# Patient Record
Sex: Female | Born: 1990 | Race: White | Hispanic: No | Smoking: Current every day smoker
Health system: Southern US, Community
[De-identification: ages and names within clinical notes are randomized; demographics above are authoritative.]

## PROBLEM LIST (undated history)

## (undated) ENCOUNTER — Emergency Department (HOSPITAL_COMMUNITY): Admission: EM | Payer: Medicaid Other | Source: Home / Self Care

## (undated) DIAGNOSIS — J45909 Unspecified asthma, uncomplicated: Secondary | ICD-10-CM

## (undated) DIAGNOSIS — F319 Bipolar disorder, unspecified: Secondary | ICD-10-CM

## (undated) DIAGNOSIS — K219 Gastro-esophageal reflux disease without esophagitis: Secondary | ICD-10-CM

## (undated) HISTORY — PX: MYRINGOTOMY: SUR874

## (undated) HISTORY — PX: TONSILLECTOMY: SUR1361

---
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
## 2011-04-13 LAB — DIFFERENTIAL
Lymphocytes Relative: 30 — ABNORMAL LOW
Monocytes Absolute: 0.8
Monocytes Relative: 10 — ABNORMAL HIGH
Neutro Abs: 4.8

## 2011-04-13 LAB — PROLACTIN: Prolactin: 8.9

## 2011-04-13 LAB — URINALYSIS, ROUTINE W REFLEX MICROSCOPIC
Bilirubin Urine: NEGATIVE
Ketones, ur: NEGATIVE
Nitrite: NEGATIVE
Urobilinogen, UA: 0.2

## 2011-04-13 LAB — CBC
RBC: 4.82
WBC: 8.3

## 2011-04-13 LAB — RAPID URINE DRUG SCREEN, HOSP PERFORMED
Benzodiazepines: NOT DETECTED
Cocaine: NOT DETECTED
Tetrahydrocannabinol: NOT DETECTED

## 2011-04-13 LAB — ETHANOL: Alcohol, Ethyl (B): <5

## 2011-04-13 LAB — HEPATIC FUNCTION PANEL
ALT: 17
Bilirubin, Direct: <0.1
Total Protein: 7.4

## 2011-04-13 LAB — LIPID PANEL
HDL: 36
VLDL: 19

## 2011-04-13 LAB — BASIC METABOLIC PANEL
Calcium: 9.3
Creatinine, Ser: 0.64
Sodium: 138

## 2011-04-13 LAB — PREGNANCY, URINE: Preg Test, Ur: NEGATIVE

## 2011-04-13 LAB — CORTISOL-AM, BLOOD: Cortisol - AM: 20

## 2011-04-13 LAB — HEMOGLOBIN A1C
Hgb A1c MFr Bld: 5.2
Mean Plasma Glucose: 108

## 2011-04-13 LAB — TSH: TSH: 2.85

## 2015-05-07 ENCOUNTER — Encounter (HOSPITAL_COMMUNITY): Payer: Self-pay | Admitting: *Deleted

## 2015-05-07 ENCOUNTER — Emergency Department (HOSPITAL_COMMUNITY)
Admission: EM | Admit: 2015-05-07 | Discharge: 2015-05-07 | Disposition: A | Discharge status: Home / Self Care | Payer: Self-pay | Attending: Emergency Medicine | Admitting: Emergency Medicine

## 2015-05-07 DIAGNOSIS — J45909 Unspecified asthma, uncomplicated: Secondary | ICD-10-CM | POA: Insufficient documentation

## 2015-05-07 DIAGNOSIS — Z8659 Personal history of other mental and behavioral disorders: Secondary | ICD-10-CM | POA: Insufficient documentation

## 2015-05-07 DIAGNOSIS — L02411 Cutaneous abscess of right axilla: Secondary | ICD-10-CM | POA: Insufficient documentation

## 2015-05-07 DIAGNOSIS — Z720 Tobacco use: Secondary | ICD-10-CM | POA: Insufficient documentation

## 2015-05-07 HISTORY — DX: Bipolar disorder, unspecified: F31.9

## 2015-05-07 HISTORY — DX: Unspecified asthma, uncomplicated: J45.909

## 2015-05-07 MED ORDER — HYDROCODONE-ACETAMINOPHEN 5-325 MG PO TABS
1.0000 | ORAL_TABLET | Freq: Once | ORAL | Status: AC
  Administered 2015-05-07: 1 via ORAL
  Filled 2015-05-07: qty 1

## 2015-05-07 MED ORDER — LIDOCAINE HCL (PF) 2 % IJ SOLN
10.0000 mL | Freq: Once | INTRAMUSCULAR | Status: AC
  Administered 2015-05-07: 10 mL via INTRADERMAL
  Filled 2015-05-07: qty 10

## 2015-05-07 MED ORDER — POVIDONE-IODINE 10 % EX SOLN
CUTANEOUS | Status: AC
  Administered 2015-05-07: 13:00:00
  Filled 2015-05-07: qty 118

## 2015-05-07 MED ORDER — LIDOCAINE-EPINEPHRINE-TETRACAINE (LET) SOLUTION
3.0000 mL | Freq: Once | NASAL | Status: AC
  Administered 2015-05-07: 3 mL via TOPICAL
  Filled 2015-05-07: qty 3

## 2015-05-07 MED ORDER — SULFAMETHOXAZOLE-TRIMETHOPRIM 800-160 MG PO TABS: 1.0000 | ORAL_TABLET | Freq: Two times a day (BID) | ORAL | Status: AC

## 2015-05-07 MED ORDER — HYDROCODONE-ACETAMINOPHEN 5-325 MG PO TABS: ORAL_TABLET | ORAL | Status: DC

## 2015-05-07 NOTE — Discharge Instructions (Signed)
Abscess °An abscess (boil or furuncle) is an infected area on or under the skin. This area is filled with yellowish-white fluid (pus) and other material (debris). °HOME CARE  °· Only take medicines as told by your doctor. °· If you were given antibiotic medicine, take it as directed. Finish the medicine even if you start to feel better. °· If gauze is used, follow your doctor's directions for changing the gauze. °· To avoid spreading the infection: °¨ Keep your abscess covered with a bandage. °¨ Wash your hands well. °¨ Do not share personal care items, towels, or whirlpools with others. °¨ Avoid skin contact with others. °· Keep your skin and clothes clean around the abscess. °· Keep all doctor visits as told. °GET HELP RIGHT AWAY IF:  °· You have more pain, puffiness (swelling), or redness in the wound site. °· You have more fluid or blood coming from the wound site. °· You have muscle aches, chills, or you feel sick. °· You have a fever. °MAKE SURE YOU:  °· Understand these instructions. °· Will watch your condition. °· Will get help right away if you are not doing well or get worse. °  °This information is not intended to replace advice given to you by your health care provider. Make sure you discuss any questions you have with your health care provider. °  °Document Released: 12/02/2007 Document Revised: 12/15/2011 Document Reviewed: 08/29/2011 °Elsevier Interactive Patient Education ©2016 Elsevier Inc. ° °

## 2015-05-07 NOTE — ED Notes (Signed)
Pt has abscess to right under arm. Pt states it started 3 days ago and she popped it yesterday with white and bloody discharge. NAD noted. Pt denies emesis/diarrhea.

## 2015-05-07 NOTE — ED Provider Notes (Signed)
CSN: [id number]     Arrival date & time 05/07/15  1036 History   First MD Initiated Contact with Patient 05/07/15 1106     Chief Complaint  Patient presents with  . Abscess     (Consider location/radiation/quality/duration/timing/severity/associated sxs/prior Treatment) HPI   [name] is a 24 y.o. female who presents to the Emergency Department complaining of pain, swelling and redness to her right axilla.  Symptoms began 3 days ago after shaving with a different razor.  She reports increasing pain and redness spreading around her underarm.  She reports squeezing on the area and notes drainage and bleeding to the area.  She denies neck pain, fever, chills, chest pain or hx of MRSA.  Pain is worse with right arm movement.    Past Medical History  Diagnosis Date  . Asthma   . Bipolar 1 disorder [facility](HCC)    Past Surgical History  Procedure Laterality Date  . Tonsillectomy     No family history on file. Social History  Substance Use Topics  . Smoking status: Current Every Day Smoker -- 1.50 packs/day    Types: Cigarettes  . Smokeless tobacco: None  . Alcohol Use: Yes   OB History    No data available     Review of Systems  Constitutional: Negative for fever and chills.  Gastrointestinal: Negative for nausea and vomiting.  Musculoskeletal: Negative for joint swelling and arthralgias.  Skin: Positive for color change.       Abscess   Hematological: Negative for adenopathy.  All other systems reviewed and are negative.     Allergies  Trileptal  Home Medications   Prior to Admission medications   Not on File   BP 125/78 mmHg  Pulse 115  Temp(Src) 98.2 F (36.8 C) (Oral)  Resp 15  Ht 5\' 7"  (1.702 m)  Wt 262 lb 12.8 oz (119.205 kg)  BMI 41.15 kg/m2  SpO2 100%  LMP 04/30/2015 Physical Exam  Constitutional: She is oriented to person, place, and time. She appears well-developed and well-nourished. No distress.  HENT:  Head: Normocephalic and atraumatic.   Neck: Normal range of motion.  Cardiovascular: Normal rate, regular rhythm and normal heart sounds.   No murmur heard. Pulmonary/Chest: Effort normal and breath sounds normal. No respiratory distress.  Musculoskeletal: Normal range of motion.  Lymphadenopathy:    She has no cervical adenopathy.  Neurological: She is alert and oriented to person, place, and time. She exhibits normal muscle tone. Coordination normal.  Skin: Skin is warm and dry. There is erythema.  Abscess to the right axilla with mild to moderate induration and surrounding erythema.  No drainage.  Nursing note and vitals reviewed.   ED Course  Procedures (including critical care time)   INCISION AND DRAINAGE Performed by: Maxwell CaulRIPLETT,Aarya Quebedeaux L. Consent: Verbal consent obtained. Risks and benefits: risks, benefits and alternatives were discussed Type: abscess  Body area: right axilla   Anesthesia: local infiltration  Incision was made with a #11 scalpel.  Local anesthetic: LET, lidocaine 2 % w/o epinephrine  Anesthetic total:  3ml, 2 ml respectively  Complexity: complex Blunt dissection to break up loculations  Drainage: purulent  Drainage amount: small  Packing material: 1/4 in iodoform gauze  Patient tolerance: Patient tolerated the procedure well with no immediate complications.    MDM   Final diagnoses:  Abscess of axilla, right   Pt well appearing.  Vitals stable.  Pt agrees to warm wet compresses, packing removal in two days.  Rx for vicodin  and Bactrim.       Pauline Aus, PA-C 05/08/15 1920  Lavera Guise, MD 05/08/15 [phone]

## 2015-05-07 NOTE — ED Notes (Signed)
PA at bedside.

## 2015-07-25 ENCOUNTER — Emergency Department (HOSPITAL_COMMUNITY): Payer: Self-pay

## 2015-07-25 ENCOUNTER — Encounter (HOSPITAL_COMMUNITY): Payer: Self-pay | Admitting: Emergency Medicine

## 2015-07-25 ENCOUNTER — Emergency Department (HOSPITAL_COMMUNITY)
Admission: EM | Admit: 2015-07-25 | Discharge: 2015-07-25 | Disposition: A | Discharge status: Home / Self Care | Payer: Self-pay | Attending: Emergency Medicine | Admitting: Emergency Medicine

## 2015-07-25 DIAGNOSIS — Z8659 Personal history of other mental and behavioral disorders: Secondary | ICD-10-CM | POA: Insufficient documentation

## 2015-07-25 DIAGNOSIS — M5432 Sciatica, left side: Secondary | ICD-10-CM | POA: Insufficient documentation

## 2015-07-25 DIAGNOSIS — Z3202 Encounter for pregnancy test, result negative: Secondary | ICD-10-CM | POA: Insufficient documentation

## 2015-07-25 DIAGNOSIS — G8929 Other chronic pain: Secondary | ICD-10-CM | POA: Insufficient documentation

## 2015-07-25 DIAGNOSIS — J45909 Unspecified asthma, uncomplicated: Secondary | ICD-10-CM | POA: Insufficient documentation

## 2015-07-25 DIAGNOSIS — F17210 Nicotine dependence, cigarettes, uncomplicated: Secondary | ICD-10-CM | POA: Insufficient documentation

## 2015-07-25 MED ORDER — IBUPROFEN 800 MG PO TABS: 800.0000 mg | ORAL_TABLET | Freq: Three times a day (TID) | ORAL | Status: DC

## 2015-07-25 MED ORDER — METHOCARBAMOL 500 MG PO TABS: 1000.0000 mg | ORAL_TABLET | Freq: Four times a day (QID) | ORAL | Status: AC

## 2015-07-25 MED ORDER — TRAMADOL HCL 50 MG PO TABS: 50.0000 mg | ORAL_TABLET | Freq: Four times a day (QID) | ORAL | Status: DC | PRN

## 2015-07-25 MED ORDER — KETOROLAC TROMETHAMINE 60 MG/2ML IM SOLN
60.0000 mg | Freq: Once | INTRAMUSCULAR | Status: AC
  Administered 2015-07-25: 60 mg via INTRAMUSCULAR
  Filled 2015-07-25: qty 2

## 2015-07-25 MED ORDER — METHOCARBAMOL 500 MG PO TABS
1000.0000 mg | ORAL_TABLET | Freq: Once | ORAL | Status: AC
  Administered 2015-07-25: 1000 mg via ORAL
  Filled 2015-07-25: qty 2

## 2015-07-25 NOTE — ED Notes (Signed)
POC urine preg was negative. Not crossing over in system.

## 2015-07-25 NOTE — Discharge Instructions (Signed)
Sciatica °Sciatica is pain, weakness, numbness, or tingling along the path of the sciatic nerve. The nerve starts in the lower back and runs down the back of each leg. The nerve controls the muscles in the lower leg and in the back of the knee, while also providing sensation to the back of the thigh, lower leg, and the sole of your foot. Sciatica is a symptom of another medical condition. For instance, nerve damage or certain conditions, such as a herniated disk or bone spur on the spine, pinch or put pressure on the sciatic nerve. This causes the pain, weakness, or other sensations normally associated with sciatica. Generally, sciatica only affects one side of the body. °CAUSES  °· Herniated or slipped disc. °· Degenerative disk disease. °· A pain disorder involving the narrow muscle in the buttocks (piriformis syndrome). °· Pelvic injury or fracture. °· Pregnancy. °· Tumor (rare). °SYMPTOMS  °Symptoms can vary from mild to very severe. The symptoms usually travel from the low back to the buttocks and down the back of the leg. Symptoms can include: °· Mild tingling or dull aches in the lower back, leg, or hip. °· Numbness in the back of the calf or sole of the foot. °· Burning sensations in the lower back, leg, or hip. °· Sharp pains in the lower back, leg, or hip. °· Leg weakness. °· Severe back pain inhibiting movement. °These symptoms may get worse with coughing, sneezing, laughing, or prolonged sitting or standing. Also, being overweight may worsen symptoms. °DIAGNOSIS  °Your caregiver will perform a physical exam to look for common symptoms of sciatica. He or she may ask you to do certain movements or activities that would trigger sciatic nerve pain. Other tests may be performed to find the cause of the sciatica. These may include: °· Blood tests. °· X-rays. °· Imaging tests, such as an MRI or CT scan. °TREATMENT  °Treatment is directed at the cause of the sciatic pain. Sometimes, treatment is not necessary  and the pain and discomfort goes away on its own. If treatment is needed, your caregiver may suggest: °· Over-the-counter medicines to relieve pain. °· Prescription medicines, such as anti-inflammatory medicine, muscle relaxants, or narcotics. °· Applying heat or ice to the painful area. °· Steroid injections to lessen pain, irritation, and inflammation around the nerve. °· Reducing activity during periods of pain. °· Exercising and stretching to strengthen your abdomen and improve flexibility of your spine. Your caregiver may suggest losing weight if the extra weight makes the back pain worse. °· Physical therapy. °· Surgery to eliminate what is pressing or pinching the nerve, such as a bone spur or part of a herniated disk. °HOME CARE INSTRUCTIONS  °· Only take over-the-counter or prescription medicines for pain or discomfort as directed by your caregiver. °· Apply ice to the affected area for 20 minutes, 3-4 times a day for the first 48-72 hours. Then try heat in the same way. °· Exercise, stretch, or perform your usual activities if these do not aggravate your pain. °· Attend physical therapy sessions as directed by your caregiver. °· Keep all follow-up appointments as directed by your caregiver. °· Do not wear high heels or shoes that do not provide proper support. °· Check your mattress to see if it is too soft. A firm mattress may lessen your pain and discomfort. °SEEK IMMEDIATE MEDICAL CARE IF:  °· You lose control of your bowel or bladder (incontinence). °· You have increasing weakness in the lower back, pelvis, buttocks,   or legs.  You have redness or swelling of your back.  You have a burning sensation when you urinate.  You have pain that gets worse when you lie down or awakens you at night.  Your pain is worse than you have experienced in the past.  Your pain is lasting longer than 4 weeks.  You are suddenly losing weight without reason. MAKE SURE YOU:  Understand these  instructions.  Will watch your condition.  Will get help right away if you are not doing well or get worse.   This information is not intended to replace advice given to you by your health care provider. Make sure you discuss any questions you have with your health care provider.   Document Released: 06/09/2001 Document Revised: 03/06/2015 Document Reviewed: 10/25/2011 Elsevier Interactive Patient Education 2016 [employer].     Do not drive within 4 hours of taking tramadol or robaxin as these will make you drowsy.  Avoid lifting,  Bending,  Twisting or any other activity that worsens your pain over the next week.  Apply an  icepack  to your lower back for 10-15 minutes every 2 hours for the next 2 days.  You should get rechecked if your symptoms are not better over the next 5 days,  Or you develop increased pain,  Weakness in your leg(s) or loss of bladder or bowel function - these are symptoms of a worse injury.  Your xrays are stable tonight.

## 2015-07-25 NOTE — ED Notes (Signed)
Pt c/o lower back pain that radiates down the left leg after a fall 2 days ago.

## 2015-07-25 NOTE — ED Notes (Signed)
EDPa in room with pt.

## 2015-07-26 LAB — POC URINE PREG, ED: Preg Test, Ur: NEGATIVE

## 2015-07-26 NOTE — ED Provider Notes (Signed)
CSN: [id number]     Arrival date & time 07/25/15  1936 History   First MD Initiated Contact with Patient 07/25/15 1949     Chief Complaint  Patient presents with  . Back Pain     (Consider location/radiation/quality/duration/timing/severity/associated sxs/prior Treatment) The history is provided by the patient and a parent.   [name] is a 25 y.o. 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  She describes tripping and falling backward over the porch railing, describes doing a "backward flip" before landing on her buttocks and lower back on grass and hard ground.  There is radiation of pain into her left posterior leg to her heel which is new.  There has been no weakness  in the lower extremities and no urinary or bowel retention or incontinence but reports feeling numb along her left lateral leg.  Patient does not have a history of cancer or IVDU.  The patient has taken tylenol  without significant relief of symptoms.     Past Medical History  Diagnosis Date  . Asthma   . Bipolar 1 disorder [facility])    Past Surgical History  Procedure Laterality Date  . Tonsillectomy     History reviewed. No pertinent family history. Social History  Substance Use Topics  . Smoking status: Current Every Day Smoker -- 1.50 packs/day    Types: Cigarettes  . Smokeless tobacco: None  . Alcohol Use: No   OB History    No data available     Review of Systems  Constitutional: Negative for fever.  Respiratory: Negative for shortness of breath.   Cardiovascular: Negative for chest pain and leg swelling.  Gastrointestinal: Negative for abdominal pain, constipation and abdominal distention.  Genitourinary: Negative for dysuria, urgency, frequency, flank pain and difficulty urinating.  Musculoskeletal: Positive for back pain. Negative for joint swelling  and gait problem.  Skin: Negative for rash.  Neurological: Positive for numbness. Negative for weakness.      Allergies  Trileptal  Home Medications   Prior to Admission medications   Medication Sig Start Date End Date Taking? Authorizing Provider  acetaminophen (TYLENOL) 500 MG tablet Take 500 mg by mouth every 6 (six) hours as needed for mild pain or moderate pain.   Yes Historical Provider, MD  ibuprofen (ADVIL,MOTRIN) 800 MG tablet Take 1 tablet (800 mg total) by mouth 3 (three) times daily. 07/25/15   Burgess Amor, PA-C  methocarbamol (ROBAXIN) 500 MG tablet Take 2 tablets (1,000 mg total) by mouth 4 (four) times daily. 07/25/15 08/04/15  Burgess Amor, PA-C  traMADol (ULTRAM) 50 MG tablet Take 1 tablet (50 mg total) by mouth every 6 (six) hours as needed. 07/25/15   Burgess Amor, PA-C   BP 106/63 mmHg  Pulse 85  Temp(Src) 98.3 F (36.8 C) (Oral)  Resp 16  Ht  (1.702 m)  Wt 110.768 kg  BMI 38.24 kg/m2  SpO2 96%  LMP 07/02/2015 Physical Exam  Constitutional: She appears well-developed and well-nourished.  HENT:  Head: Normocephalic.  Eyes: Conjunctivae are normal.  Neck: Normal range of motion. Neck supple.  Cardiovascular: Normal rate and intact distal pulses.   Pedal pulses normal.  Pulmonary/Chest: Effort normal.  Abdominal: Soft. Bowel sounds are normal. She exhibits no distension and no mass.  Musculoskeletal: Normal range of motion. She exhibits no edema.  Lumbar back: She exhibits tenderness. She exhibits no swelling, no edema and no spasm.  ttp left paralumbar and midline lumbar spine.   Neurological: She is alert. She has normal strength. She displays no atrophy and no tremor. A sensory deficit is present. Gait normal.  Reflex Scores:      Patellar reflexes are 2+ on the right side and 2+ on the left side. No strength deficit noted in hip and knee flexor and extensor muscle groups.  Ankle flexion and extension intact. No foot drop.  She reports decreased  sensation to fine touch lateral lower leg and foot.  Skin: Skin is warm and dry.  Psychiatric: She has a normal mood and affect.  Nursing note and vitals reviewed.   ED Course  Procedures (including critical care time) Labs Review Labs Reviewed  POC URINE PREG, ED    Imaging Review Dg Lumbar Spine Complete  07/25/2015  CLINICAL DATA:  Low back pain radiating down the left leg. Left leg gave out 2 days ago causing the patient to fall backwards onto a porch railing. EXAM: LUMBAR SPINE - COMPLETE 4+ VIEW COMPARISON:  None. FINDINGS: There is no evidence of lumbar spine fracture. Alignment is normal. Intervertebral disc spaces are maintained. IMPRESSION: Negative. Electronically Signed   By: Burman Nieves M.D.   On: 07/25/2015 20:27   I have personally reviewed and evaluated these images and lab results as part of my medical decision-making.   EKG Interpretation None      MDM   Final diagnoses:  Sciatica of left side    No neuro deficit on exam or by history to suggest emergent or surgical presentation.  discussed worsened sx that should prompt immediate re-evaluation including distal weakness, bowel/bladder retention/incontinence.   Pt prescribed ibuprofen, robaxin, tramadol.  Heat tx, activity as tolerated.  F/u with pcp if not improving over the next week.  The patient appears reasonably screened and/or stabilized for discharge and I doubt any other medical condition or other [facility] requiring further screening, evaluation, or treatment in the ED at this time prior to discharge.        Burgess Amor, PA-C 07/26/15 1328  Linwood Dibbles, MD 07/27/15 [phone]

## 2015-10-07 ENCOUNTER — Other Ambulatory Visit: Payer: Self-pay | Admitting: Neurosurgery

## 2015-10-07 DIAGNOSIS — M5126 Other intervertebral disc displacement, lumbar region: Secondary | ICD-10-CM

## 2015-10-11 ENCOUNTER — Ambulatory Visit
Admission: RE | Admit: 2015-10-11 | Discharge: 2015-10-11 | Disposition: A | Discharge status: Home / Self Care | Payer: Medicaid Other | Source: Ambulatory Visit | Attending: Neurosurgery | Admitting: Neurosurgery

## 2015-10-11 DIAGNOSIS — M5126 Other intervertebral disc displacement, lumbar region: Secondary | ICD-10-CM

## 2015-10-11 MED ORDER — IOHEXOL 180 MG/ML  SOLN
1.0000 mL | Freq: Once | INTRAMUSCULAR | Status: AC | PRN
  Administered 2015-10-11: 1 mL via EPIDURAL

## 2015-10-11 MED ORDER — METHYLPREDNISOLONE ACETATE 40 MG/ML INJ SUSP (RADIOLOG
120.0000 mg | Freq: Once | INTRAMUSCULAR | Status: AC
  Administered 2015-10-11: 120 mg via EPIDURAL

## 2015-10-11 NOTE — Discharge Instructions (Signed)

## 2016-05-04 ENCOUNTER — Encounter (HOSPITAL_COMMUNITY): Payer: Self-pay | Admitting: *Deleted

## 2016-05-04 DIAGNOSIS — R0981 Nasal congestion: Secondary | ICD-10-CM | POA: Insufficient documentation

## 2016-05-04 DIAGNOSIS — R0602 Shortness of breath: Secondary | ICD-10-CM | POA: Diagnosis not present

## 2016-05-04 DIAGNOSIS — Z5321 Procedure and treatment not carried out due to patient leaving prior to being seen by health care provider: Secondary | ICD-10-CM | POA: Insufficient documentation

## 2016-05-04 DIAGNOSIS — F17210 Nicotine dependence, cigarettes, uncomplicated: Secondary | ICD-10-CM | POA: Insufficient documentation

## 2016-05-04 NOTE — ED Triage Notes (Signed)
Pt c/o sob that started x 3 days ago; pt states she had congestion that started in her head and has moved to her chest

## 2016-05-05 ENCOUNTER — Emergency Department (HOSPITAL_COMMUNITY)
Admission: EM | Admit: 2016-05-05 | Discharge: 2016-05-05 | Disposition: A | Discharge status: Home / Self Care | Payer: Medicaid Other | Attending: Dermatology | Admitting: Dermatology

## 2016-05-05 NOTE — ED Notes (Signed)
Pt told registration she was going to [facility]

## 2016-12-23 ENCOUNTER — Emergency Department (HOSPITAL_COMMUNITY)
Admission: EM | Admit: 2016-12-23 | Discharge: 2016-12-23 | Disposition: A | Discharge status: Home Health | Payer: Medicaid Other | Attending: Emergency Medicine | Admitting: Emergency Medicine

## 2016-12-23 ENCOUNTER — Emergency Department (HOSPITAL_COMMUNITY): Payer: Medicaid Other

## 2016-12-23 ENCOUNTER — Encounter (HOSPITAL_COMMUNITY): Payer: Self-pay | Admitting: Emergency Medicine

## 2016-12-23 DIAGNOSIS — Y929 Unspecified place or not applicable: Secondary | ICD-10-CM | POA: Diagnosis not present

## 2016-12-23 DIAGNOSIS — W19XXXA Unspecified fall, initial encounter: Secondary | ICD-10-CM | POA: Insufficient documentation

## 2016-12-23 DIAGNOSIS — S63501A Unspecified sprain of right wrist, initial encounter: Secondary | ICD-10-CM | POA: Insufficient documentation

## 2016-12-23 DIAGNOSIS — F17210 Nicotine dependence, cigarettes, uncomplicated: Secondary | ICD-10-CM | POA: Insufficient documentation

## 2016-12-23 DIAGNOSIS — Y939 Activity, unspecified: Secondary | ICD-10-CM | POA: Diagnosis not present

## 2016-12-23 DIAGNOSIS — J45909 Unspecified asthma, uncomplicated: Secondary | ICD-10-CM | POA: Diagnosis not present

## 2016-12-23 DIAGNOSIS — Y999 Unspecified external cause status: Secondary | ICD-10-CM | POA: Diagnosis not present

## 2016-12-23 DIAGNOSIS — S6991XA Unspecified injury of right wrist, hand and finger(s), initial encounter: Secondary | ICD-10-CM | POA: Diagnosis present

## 2016-12-23 MED ORDER — IBUPROFEN 600 MG PO TABS: 600.0000 mg | ORAL_TABLET | Freq: Four times a day (QID) | ORAL | 0 refills | Status: DC

## 2016-12-23 MED ORDER — IBUPROFEN 800 MG PO TABS
800.0000 mg | ORAL_TABLET | Freq: Once | ORAL | Status: AC
  Administered 2016-12-23: 800 mg via ORAL
  Filled 2016-12-23: qty 1

## 2016-12-23 MED ORDER — ACETAMINOPHEN 500 MG PO TABS
1000.0000 mg | ORAL_TABLET | Freq: Once | ORAL | Status: AC
  Administered 2016-12-23: 1000 mg via ORAL
  Filled 2016-12-23: qty 2

## 2016-12-23 NOTE — ED Triage Notes (Signed)
Tripped and fell 2 days ago and caught self with right wrist. Pt c/o right wrist pain. No obvious swelling or deformity noted. Radial pulse present

## 2016-12-23 NOTE — ED Provider Notes (Signed)
[facility] DEPT Provider Note   CSN: [id number] Arrival date & time: 12/23/16  1401     History   Chief Complaint Chief Complaint  Patient presents with  . Wrist Pain    HPI [name] is a 26 y.o. female.   Wrist Pain  This is a new problem. The current episode started more than 2 days ago. The problem occurs daily. The problem has been gradually worsening. Pertinent negatives include no chest pain, no abdominal pain and no shortness of breath. Exacerbated by: movement and palpation. Nothing relieves the symptoms. She has tried a cold compress and acetaminophen for the symptoms. The treatment provided no relief.    Past Medical History:  Diagnosis Date  . Asthma   . Bipolar 1 disorder (HCC)     There are no active problems to display for this patient.   Past Surgical History:  Procedure Laterality Date  . TONSILLECTOMY      OB History    Gravida Para Term Preterm AB Living   1 1       1    SAB TAB Ectopic Multiple Live Births                   Home Medications    Prior to Admission medications   Medication Sig Start Date End Date Taking? Authorizing Provider  acetaminophen (TYLENOL) 500 MG tablet Take 500 mg by mouth every 6 (six) hours as needed for mild pain or moderate pain.    [provider]  ibuprofen (ADVIL,MOTRIN) 800 MG tablet Take 1 tablet (800 mg total) by mouth 3 (three) times daily. 07/25/15   Burgess AmorIdol, Julie, PA-C  traMADol (ULTRAM) 50 MG tablet Take 1 tablet (50 mg total) by mouth every 6 (six) hours as needed. 07/25/15   Burgess AmorIdol, Julie, PA-C    Family History History reviewed. No pertinent family history.  Social History Social History  Substance Use Topics  . Smoking status: Current Every Day Smoker    Packs/day: 1.50    Types: Cigarettes  . Smokeless tobacco: Never Used  . Alcohol use No     Allergies   Trileptal [oxcarbazepine]   Review of Systems Review of Systems  Constitutional: Negative for activity change and  appetite change.  HENT: Negative for congestion, ear discharge, ear pain, facial swelling, nosebleeds, rhinorrhea, sneezing and tinnitus.   Eyes: Negative for photophobia, pain and discharge.  Respiratory: Negative for cough, choking, shortness of breath and wheezing.   Cardiovascular: Negative for chest pain, palpitations and leg swelling.  Gastrointestinal: Negative for abdominal pain, blood in stool, constipation, diarrhea, nausea and vomiting.  Genitourinary: Negative for difficulty urinating, dysuria, flank pain, frequency and hematuria.  Musculoskeletal: Positive for arthralgias. Negative for back pain, gait problem, myalgias and neck pain.  Skin: Negative for color change, rash and wound.  Neurological: Negative for dizziness, seizures, syncope, facial asymmetry, speech difficulty, weakness and numbness.  Hematological: Negative for adenopathy. Does not bruise/bleed easily.  Psychiatric/Behavioral: Negative for agitation, confusion, hallucinations, self-injury and suicidal ideas. The patient is not nervous/anxious.      Physical Exam Updated Vital Signs BP (!) 104/47 (BP Location: Right Arm)   Pulse 84   Temp 98.1 F (36.7 C) (Oral)   Resp 17   LMP 11/28/2016   SpO2 98%   Physical Exam  Constitutional: Vital signs are normal. She appears well-developed and well-nourished. She is active.  HENT:  Head: Normocephalic and atraumatic.  Right Ear: Tympanic membrane, external ear and ear canal normal.  Left Ear: Tympanic membrane, external ear and ear canal normal.  Nose: Nose normal.  Mouth/Throat: Uvula is midline, oropharynx is clear and moist and mucous membranes are normal.  Eyes: Conjunctivae, EOM and lids are normal. Pupils are equal, round, and reactive to light.  Neck: Trachea normal, normal range of motion and phonation normal. Neck supple. Carotid bruit is not present.  Cardiovascular: Normal rate, regular rhythm and normal pulses.   Abdominal: Soft. Normal appearance  and bowel sounds are normal.  Musculoskeletal:       Right wrist: She exhibits tenderness. She exhibits no effusion, no crepitus and no deformity.  Lymphadenopathy:       Head (right side): No submental, no preauricular and no posterior auricular adenopathy present.       Head (left side): No submental, no preauricular and no posterior auricular adenopathy present.    She has no cervical adenopathy.  Neurological: She is alert. She has normal strength. No cranial nerve deficit or sensory deficit. GCS eye subscore is 4. GCS verbal subscore is 5. GCS motor subscore is 6.  Skin: Skin is warm and dry.  Psychiatric: Her speech is normal.     ED Treatments / Results  Labs (all labs ordered are listed, but only abnormal results are displayed) Labs Reviewed - No data to display  EKG  EKG Interpretation None       Radiology Dg Wrist Complete Right  Result Date: 12/23/2016 CLINICAL DATA:  Initial evaluation for acute trauma, fall, pain. EXAM: RIGHT WRIST - COMPLETE 3+ VIEW COMPARISON:  None. FINDINGS: There is no evidence of fracture or dislocation. There is no evidence of arthropathy or other focal bone abnormality. Soft tissues are unremarkable. IMPRESSION: No acute fracture or dislocation about the right wrist. Electronically Signed   By: Rise Mu M.D.   On: 12/23/2016 14:37    Procedures Procedures (including critical care time)  Medications Ordered in ED Medications  acetaminophen (TYLENOL) tablet 1,000 mg (not administered)  ibuprofen (ADVIL,MOTRIN) tablet 800 mg (not administered)     Initial Impression / Assessment and Plan / ED Course  I have reviewed the triage vital signs and the nursing notes.  Pertinent labs & imaging results that were available during my care of the patient were reviewed by me and considered in my medical decision making (see chart for details).       Final Clinical Impressions(s) / ED Diagnoses MDM Vital signs stable. Xray of  The  wrist is negative. No gross neurovascuscular changes. Pt fitted with splint. Pt will use tylenol and ibuprofen for soreness. See Dr Romeo Apple for orthopedic evaluation if not improving.   Final diagnoses:  None    New Prescriptions New Prescriptions   No medications on file     Ivery Quale, Cordelia Poche 12/23/16 1627    Vanetta Mulders, MD 12/27/16 [phone]

## 2016-12-23 NOTE — Discharge Instructions (Signed)
Please use the wrist splint for 5 to 7 days. Use 600mg  of ibuprofen and 500mg  of tylenol every 6 hours for pain and soreness. Follow up with Dr Romeo AppleHarrison for orthopedic evaluation if not improving.

## 2016-12-24 ENCOUNTER — Encounter (HOSPITAL_COMMUNITY): Payer: Self-pay | Admitting: Emergency Medicine

## 2018-03-17 ENCOUNTER — Other Ambulatory Visit: Payer: Self-pay | Admitting: Neurosurgery

## 2018-04-05 NOTE — Pre-Procedure Instructions (Signed)
[name]  04/05/2018      [facility] [address] [place] - [id number] [place] [address] Phone: [phone] Fax: [phone]    Your procedure is scheduled on Oct. 21  Report to [facility] Admitting at 8:00  A.M.  Call this number if you have problems the morning of surgery:  [phone]   Remember:   Do not eat or drink after midnight.      Take these medicines the morning of surgery with A SIP OF WATER :               pirmella              Albuterol inhaler if needed--bring to hospital                7 days prior to surgery STOP taking any Aspirin(unless otherwise instructed by your surgeon), Aleve, Naproxen, Ibuprofen, Motrin, Advil, Goody's, BC's, all herbal medications, fish oil, and all vitamins    Do not wear jewelry, make-up or nail polish.  Do not wear lotions, powders, or perfumes, or deodorant.  Do not shave 48 hours prior to surgery.  Men may shave face and neck.  Do not bring valuables to the hospital.  [facility] is not responsible for any belongings or valuables.  Contacts, dentures or bridgework may not be worn into surgery.  Leave your suitcase in the car.  After surgery it may be brought to your room.  For patients admitted to the hospital, discharge time will be determined by your treatment team.  Patients discharged the day of surgery will not be allowed to drive home.    Special instructions:   [name]- Preparing For Surgery  Before surgery, you can play an important role. Because skin is not sterile, your skin needs to be as free of germs as possible. You can reduce the number of germs on your skin by washing with CHG (chlorahexidine gluconate) Soap before surgery.  CHG is an antiseptic cleaner which kills germs and bonds with the skin to continue killing germs even after washing.    Oral Hygiene is also important to reduce your risk of infection.  Remember - BRUSH YOUR TEETH THE MORNING  OF SURGERY WITH YOUR REGULAR TOOTHPASTE  Please do not use if you have an allergy to CHG or antibacterial soaps. If your skin becomes reddened/irritated stop using the CHG.  Do not shave (including legs and underarms) for at least 48 hours prior to first CHG shower. It is OK to shave your face.  Please follow these instructions carefully.   1. Shower the NIGHT BEFORE SURGERY and the MORNING OF SURGERY with CHG.   2. If you chose to wash your hair, wash your hair first as usual with your normal shampoo.  3. After you shampoo, rinse your hair and body thoroughly to remove the shampoo.  4. Use CHG as you would any other liquid soap. You can apply CHG directly to the skin and wash gently with a scrungie or a clean washcloth.   5. Apply the CHG Soap to your body ONLY FROM THE NECK DOWN.  Do not use on open wounds or open sores. Avoid contact with your eyes, ears, mouth and genitals (private parts). Wash Face and genitals (private parts)  with your normal soap.  6. Wash thoroughly, paying special attention to the area where your surgery will be performed.  7. Thoroughly rinse your  body with warm water from the neck down.  8. DO NOT shower/wash with your normal soap after using and rinsing off the CHG Soap.  9. Pat yourself dry with a CLEAN TOWEL.  10. Wear CLEAN PAJAMAS to bed the night before surgery, wear comfortable clothes the morning of surgery  11. Place CLEAN SHEETS on your bed the night of your first shower and DO NOT SLEEP WITH PETS.    Day of Surgery:  Do not apply any deodorants/lotions.  Please wear clean clothes to the hospital/surgery center.   Remember to brush your teeth WITH YOUR REGULAR TOOTHPASTE.    Please read over the following fact sheets that you were given. Coughing and Deep Breathing and Surgical Site Infection Prevention

## 2018-04-07 ENCOUNTER — Encounter (HOSPITAL_COMMUNITY)
Admission: RE | Admit: 2018-04-07 | Discharge: 2018-04-07 | Disposition: A | Discharge status: Home / Self Care | Payer: Medicaid Other | Source: Ambulatory Visit | Attending: Neurosurgery | Admitting: Neurosurgery

## 2018-04-07 ENCOUNTER — Other Ambulatory Visit: Payer: Self-pay

## 2018-04-07 ENCOUNTER — Encounter (HOSPITAL_COMMUNITY): Payer: Self-pay | Admitting: *Deleted

## 2018-04-07 DIAGNOSIS — Z01812 Encounter for preprocedural laboratory examination: Secondary | ICD-10-CM | POA: Insufficient documentation

## 2018-04-07 HISTORY — DX: Gastro-esophageal reflux disease without esophagitis: K21.9

## 2018-04-07 LAB — CBC WITH DIFFERENTIAL/PLATELET
ABS IMMATURE GRANULOCYTES: 0.06 10*3/uL (ref 0.00–0.07)
Basophils Absolute: 0.1 10*3/uL (ref 0.0–0.1)
Basophils Relative: 0 %
Eosinophils Absolute: 0.3 10*3/uL (ref 0.0–0.5)
Eosinophils Relative: 2 %
HCT: 42 % (ref 36.0–46.0)
HEMOGLOBIN: 13.5 g/dL (ref 12.0–15.0)
Immature Granulocytes: 1 %
LYMPHS PCT: 28 %
Lymphs Abs: 3.5 10*3/uL (ref 0.7–4.0)
MCH: 28.7 pg (ref 26.0–34.0)
MCHC: 32.1 g/dL (ref 30.0–36.0)
MCV: 89.2 fL (ref 80.0–100.0)
MONO ABS: 1 10*3/uL (ref 0.1–1.0)
Monocytes Relative: 8 %
NEUTROS ABS: 7.5 10*3/uL (ref 1.7–7.7)
Neutrophils Relative %: 61 %
Platelets: 326 10*3/uL (ref 150–400)
RBC: 4.71 MIL/uL (ref 3.87–5.11)
RDW: 14.2 % (ref 11.5–15.5)
WBC: 12.4 10*3/uL — AB (ref 4.0–10.5)
nRBC: 0 % (ref 0.0–0.2)

## 2018-04-07 LAB — BASIC METABOLIC PANEL
ANION GAP: 9 (ref 5–15)
CALCIUM: 9 mg/dL (ref 8.9–10.3)
CO2: 25 mmol/L (ref 22–32)
Chloride: 106 mmol/L (ref 98–111)
Creatinine, Ser: 0.6 mg/dL (ref 0.44–1.00)
GFR calc Af Amer: >60 mL/min (ref >60)
GLUCOSE: 83 mg/dL (ref 70–99)
Potassium: 4.2 mmol/L (ref 3.5–5.1)
Sodium: 140 mmol/L (ref 135–145)

## 2018-04-07 LAB — SURGICAL PCR SCREEN
MRSA, PCR: POSITIVE — AB
STAPHYLOCOCCUS AUREUS: POSITIVE — AB

## 2018-04-07 NOTE — Progress Notes (Addendum)
PCP: none, pt. Was going to [facility] [place] until the death of  Dr. Loney Hering. States she hasn't been back.  No cardiologist  Called prescription for mupirocin to  [facility] in [facility]

## 2018-04-15 MED ORDER — CEFAZOLIN SODIUM-DEXTROSE 2-4 GM/100ML-% IV SOLN
2.0000 g | INTRAVENOUS | Status: AC
  Administered 2018-04-18: 2 g via INTRAVENOUS
  Filled 2018-04-15: qty 100

## 2018-04-15 MED ORDER — VANCOMYCIN HCL 10 G IV SOLR
1500.0000 mg | INTRAVENOUS | Status: AC
  Administered 2018-04-18: 1500 mg via INTRAVENOUS
  Filled 2018-04-15: qty 1500

## 2018-04-18 ENCOUNTER — Ambulatory Visit (HOSPITAL_COMMUNITY): Payer: Medicaid Other

## 2018-04-18 ENCOUNTER — Encounter (HOSPITAL_COMMUNITY): Payer: Self-pay

## 2018-04-18 ENCOUNTER — Observation Stay (HOSPITAL_COMMUNITY)
Admission: RE | Admit: 2018-04-18 | Discharge: 2018-04-18 | Disposition: A | Discharge status: Home / Self Care | Payer: Medicaid Other | Source: Ambulatory Visit | Attending: Neurosurgery | Admitting: Neurosurgery

## 2018-04-18 ENCOUNTER — Other Ambulatory Visit: Payer: Self-pay

## 2018-04-18 ENCOUNTER — Encounter (HOSPITAL_COMMUNITY)
Admission: RE | Disposition: A | Discharge status: Home / Self Care | Payer: Self-pay | Source: Ambulatory Visit | Attending: Neurosurgery

## 2018-04-18 DIAGNOSIS — M5416 Radiculopathy, lumbar region: Secondary | ICD-10-CM | POA: Diagnosis present

## 2018-04-18 DIAGNOSIS — K219 Gastro-esophageal reflux disease without esophagitis: Secondary | ICD-10-CM | POA: Diagnosis not present

## 2018-04-18 DIAGNOSIS — Z888 Allergy status to other drugs, medicaments and biological substances status: Secondary | ICD-10-CM | POA: Insufficient documentation

## 2018-04-18 DIAGNOSIS — F319 Bipolar disorder, unspecified: Secondary | ICD-10-CM | POA: Diagnosis not present

## 2018-04-18 DIAGNOSIS — F17210 Nicotine dependence, cigarettes, uncomplicated: Secondary | ICD-10-CM | POA: Insufficient documentation

## 2018-04-18 DIAGNOSIS — Z79899 Other long term (current) drug therapy: Secondary | ICD-10-CM | POA: Diagnosis not present

## 2018-04-18 DIAGNOSIS — J45909 Unspecified asthma, uncomplicated: Secondary | ICD-10-CM | POA: Insufficient documentation

## 2018-04-18 DIAGNOSIS — Z419 Encounter for procedure for purposes other than remedying health state, unspecified: Secondary | ICD-10-CM

## 2018-04-18 DIAGNOSIS — M5117 Intervertebral disc disorders with radiculopathy, lumbosacral region: Principal | ICD-10-CM | POA: Insufficient documentation

## 2018-04-18 HISTORY — PX: LUMBAR LAMINECTOMY/DECOMPRESSION MICRODISCECTOMY: SHX5026

## 2018-04-18 LAB — POCT PREGNANCY, URINE: Preg Test, Ur: NEGATIVE

## 2018-04-18 SURGERY — LUMBAR LAMINECTOMY/DECOMPRESSION MICRODISCECTOMY 1 LEVEL
Anesthesia: General | Site: Back | Laterality: Left

## 2018-04-18 MED ORDER — ROCURONIUM BROMIDE 50 MG/5ML IV SOSY
PREFILLED_SYRINGE | INTRAVENOUS | Status: DC | PRN
  Administered 2018-04-18: 50 mg via INTRAVENOUS
  Administered 2018-04-18: 20 mg via INTRAVENOUS

## 2018-04-18 MED ORDER — CHLORHEXIDINE GLUCONATE CLOTH 2 % EX PADS: 6.0000 | MEDICATED_PAD | Freq: Once | CUTANEOUS | Status: DC

## 2018-04-18 MED ORDER — DEXAMETHASONE SODIUM PHOSPHATE 10 MG/ML IJ SOLN
INTRAMUSCULAR | Status: AC
  Filled 2018-04-18: qty 1

## 2018-04-18 MED ORDER — 0.9 % SODIUM CHLORIDE (POUR BTL) OPTIME
TOPICAL | Status: DC | PRN
  Administered 2018-04-18: 1000 mL

## 2018-04-18 MED ORDER — CYCLOBENZAPRINE HCL 10 MG PO TABS
ORAL_TABLET | ORAL | Status: AC
  Administered 2018-04-18: 10 mg via ORAL
  Filled 2018-04-18: qty 1

## 2018-04-18 MED ORDER — PROPOFOL 10 MG/ML IV BOLUS
INTRAVENOUS | Status: DC | PRN
  Administered 2018-04-18: 200 mg via INTRAVENOUS
  Administered 2018-04-18: 40 mg via INTRAVENOUS

## 2018-04-18 MED ORDER — KETOROLAC TROMETHAMINE 30 MG/ML IJ SOLN
INTRAMUSCULAR | Status: AC
  Filled 2018-04-18: qty 1

## 2018-04-18 MED ORDER — SODIUM CHLORIDE 0.9 % IV SOLN: 250.0000 mL | INTRAVENOUS | Status: DC

## 2018-04-18 MED ORDER — FENTANYL CITRATE (PF) 100 MCG/2ML IJ SOLN: 25.0000 ug | INTRAMUSCULAR | Status: DC | PRN

## 2018-04-18 MED ORDER — HYDROMORPHONE HCL 1 MG/ML IJ SOLN: 1.0000 mg | INTRAMUSCULAR | Status: DC | PRN

## 2018-04-18 MED ORDER — HYDROCODONE-ACETAMINOPHEN 5-325 MG PO TABS: 1.0000 | ORAL_TABLET | ORAL | Status: DC | PRN

## 2018-04-18 MED ORDER — ACETAMINOPHEN 650 MG RE SUPP: 650.0000 mg | RECTAL | Status: DC | PRN

## 2018-04-18 MED ORDER — DEXAMETHASONE SODIUM PHOSPHATE 10 MG/ML IJ SOLN
10.0000 mg | INTRAMUSCULAR | Status: AC
  Administered 2018-04-18: 10 mg via INTRAVENOUS

## 2018-04-18 MED ORDER — MENTHOL 3 MG MT LOZG: 1.0000 | LOZENGE | OROMUCOSAL | Status: DC | PRN

## 2018-04-18 MED ORDER — FENTANYL CITRATE (PF) 250 MCG/5ML IJ SOLN
INTRAMUSCULAR | Status: AC
  Filled 2018-04-18: qty 5

## 2018-04-18 MED ORDER — PHENOL 1.4 % MT LIQD: 1.0000 | OROMUCOSAL | Status: DC | PRN

## 2018-04-18 MED ORDER — SUGAMMADEX SODIUM 500 MG/5ML IV SOLN
INTRAVENOUS | Status: AC
  Filled 2018-04-18: qty 5

## 2018-04-18 MED ORDER — KETOROLAC TROMETHAMINE 30 MG/ML IJ SOLN
INTRAMUSCULAR | Status: DC | PRN
  Administered 2018-04-18: 30 mg via INTRAVENOUS

## 2018-04-18 MED ORDER — ALBUTEROL SULFATE HFA 108 (90 BASE) MCG/ACT IN AERS
INHALATION_SPRAY | RESPIRATORY_TRACT | Status: AC
  Filled 2018-04-18: qty 6.7

## 2018-04-18 MED ORDER — LIDOCAINE 2% (20 MG/ML) 5 ML SYRINGE
INTRAMUSCULAR | Status: AC
  Filled 2018-04-18: qty 5

## 2018-04-18 MED ORDER — FENTANYL CITRATE (PF) 100 MCG/2ML IJ SOLN
INTRAMUSCULAR | Status: AC
  Filled 2018-04-18: qty 2

## 2018-04-18 MED ORDER — NORETHINDRONE-ETH ESTRADIOL 1-35 MG-MCG PO TABS: 1.0000 | ORAL_TABLET | Freq: Every day | ORAL | Status: DC

## 2018-04-18 MED ORDER — SODIUM CHLORIDE 0.9% FLUSH: 3.0000 mL | INTRAVENOUS | Status: DC | PRN

## 2018-04-18 MED ORDER — ACETAMINOPHEN 325 MG PO TABS: 650.0000 mg | ORAL_TABLET | ORAL | Status: DC | PRN

## 2018-04-18 MED ORDER — ONDANSETRON HCL 4 MG/2ML IJ SOLN
INTRAMUSCULAR | Status: AC
  Filled 2018-04-18: qty 2

## 2018-04-18 MED ORDER — CEFAZOLIN SODIUM-DEXTROSE 1-4 GM/50ML-% IV SOLN
1.0000 g | Freq: Three times a day (TID) | INTRAVENOUS | Status: DC
  Administered 2018-04-18: 1 g via INTRAVENOUS
  Filled 2018-04-18: qty 50

## 2018-04-18 MED ORDER — THROMBIN 5000 UNITS EX SOLR
CUTANEOUS | Status: DC | PRN
  Administered 2018-04-18 (×2): 5000 [IU] via TOPICAL

## 2018-04-18 MED ORDER — CYCLOBENZAPRINE HCL 10 MG PO TABS
10.0000 mg | ORAL_TABLET | Freq: Three times a day (TID) | ORAL | Status: DC | PRN
  Administered 2018-04-18: 10 mg via ORAL

## 2018-04-18 MED ORDER — PROPOFOL 10 MG/ML IV BOLUS
INTRAVENOUS | Status: AC
  Filled 2018-04-18: qty 20

## 2018-04-18 MED ORDER — CYCLOBENZAPRINE HCL 10 MG PO TABS: 10.0000 mg | ORAL_TABLET | Freq: Three times a day (TID) | ORAL | 0 refills | Status: DC | PRN

## 2018-04-18 MED ORDER — SODIUM CHLORIDE 0.9% FLUSH: 3.0000 mL | Freq: Two times a day (BID) | INTRAVENOUS | Status: DC

## 2018-04-18 MED ORDER — ONDANSETRON HCL 4 MG/2ML IJ SOLN
INTRAMUSCULAR | Status: DC | PRN
  Administered 2018-04-18: 4 mg via INTRAVENOUS

## 2018-04-18 MED ORDER — ONDANSETRON HCL 4 MG/2ML IJ SOLN: 4.0000 mg | Freq: Four times a day (QID) | INTRAMUSCULAR | Status: DC | PRN

## 2018-04-18 MED ORDER — ONDANSETRON HCL 4 MG PO TABS: 4.0000 mg | ORAL_TABLET | Freq: Four times a day (QID) | ORAL | Status: DC | PRN

## 2018-04-18 MED ORDER — BUPIVACAINE HCL (PF) 0.25 % IJ SOLN
INTRAMUSCULAR | Status: AC
  Filled 2018-04-18: qty 30

## 2018-04-18 MED ORDER — ALBUTEROL SULFATE HFA 108 (90 BASE) MCG/ACT IN AERS
INHALATION_SPRAY | RESPIRATORY_TRACT | Status: DC | PRN
  Administered 2018-04-18 (×2): 4 via RESPIRATORY_TRACT

## 2018-04-18 MED ORDER — KETOROLAC TROMETHAMINE 15 MG/ML IJ SOLN
30.0000 mg | Freq: Four times a day (QID) | INTRAMUSCULAR | Status: DC
  Administered 2018-04-18 (×2): 30 mg via INTRAVENOUS
  Filled 2018-04-18 (×2): qty 2

## 2018-04-18 MED ORDER — OXYCODONE HCL 5 MG PO TABS
5.0000 mg | ORAL_TABLET | Freq: Once | ORAL | Status: AC | PRN
  Administered 2018-04-18: 5 mg via ORAL

## 2018-04-18 MED ORDER — MIDAZOLAM HCL 5 MG/5ML IJ SOLN
INTRAMUSCULAR | Status: DC | PRN
  Administered 2018-04-18: 2 mg via INTRAVENOUS

## 2018-04-18 MED ORDER — BUPIVACAINE HCL (PF) 0.25 % IJ SOLN
INTRAMUSCULAR | Status: DC | PRN
  Administered 2018-04-18: 20 mL

## 2018-04-18 MED ORDER — PHENYLEPHRINE 40 MCG/ML (10ML) SYRINGE FOR IV PUSH (FOR BLOOD PRESSURE SUPPORT)
PREFILLED_SYRINGE | INTRAVENOUS | Status: AC
  Filled 2018-04-18: qty 10

## 2018-04-18 MED ORDER — HEMOSTATIC AGENTS (NO CHARGE) OPTIME
TOPICAL | Status: DC | PRN
  Administered 2018-04-18: 1 via TOPICAL

## 2018-04-18 MED ORDER — SUGAMMADEX SODIUM 500 MG/5ML IV SOLN
INTRAVENOUS | Status: DC | PRN
  Administered 2018-04-18: 350 mg via INTRAVENOUS

## 2018-04-18 MED ORDER — ALBUTEROL SULFATE HFA 108 (90 BASE) MCG/ACT IN AERS: 2.0000 | INHALATION_SPRAY | Freq: Four times a day (QID) | RESPIRATORY_TRACT | Status: DC | PRN

## 2018-04-18 MED ORDER — THROMBIN 5000 UNITS EX SOLR
CUTANEOUS | Status: AC
  Filled 2018-04-18: qty 10000

## 2018-04-18 MED ORDER — SODIUM CHLORIDE 0.9 % IV SOLN
INTRAVENOUS | Status: DC | PRN
  Administered 2018-04-18: 11:00:00

## 2018-04-18 MED ORDER — OXYCODONE HCL 5 MG PO TABS
ORAL_TABLET | ORAL | Status: AC
  Administered 2018-04-18: 5 mg via ORAL
  Filled 2018-04-18: qty 1

## 2018-04-18 MED ORDER — OXYCODONE HCL 5 MG/5ML PO SOLN: 5.0000 mg | Freq: Once | ORAL | Status: AC | PRN

## 2018-04-18 MED ORDER — HYDROCODONE-ACETAMINOPHEN 5-325 MG PO TABS: 1.0000 | ORAL_TABLET | ORAL | 0 refills | Status: DC | PRN

## 2018-04-18 MED ORDER — ROCURONIUM BROMIDE 50 MG/5ML IV SOSY
PREFILLED_SYRINGE | INTRAVENOUS | Status: AC
  Filled 2018-04-18: qty 5

## 2018-04-18 MED ORDER — LACTATED RINGERS IV SOLN
INTRAVENOUS | Status: DC
  Administered 2018-04-18: 09:00:00 via INTRAVENOUS

## 2018-04-18 MED ORDER — FENTANYL CITRATE (PF) 100 MCG/2ML IJ SOLN
INTRAMUSCULAR | Status: DC | PRN
  Administered 2018-04-18 (×3): 50 ug via INTRAVENOUS
  Administered 2018-04-18: 100 ug via INTRAVENOUS

## 2018-04-18 MED ORDER — HYDROCODONE-ACETAMINOPHEN 10-325 MG PO TABS
2.0000 | ORAL_TABLET | ORAL | Status: DC | PRN
  Administered 2018-04-18: 2 via ORAL
  Filled 2018-04-18: qty 2

## 2018-04-18 MED ORDER — LIDOCAINE 2% (20 MG/ML) 5 ML SYRINGE
INTRAMUSCULAR | Status: DC | PRN
  Administered 2018-04-18: 60 mg via INTRAVENOUS

## 2018-04-18 MED ORDER — MIDAZOLAM HCL 2 MG/2ML IJ SOLN
INTRAMUSCULAR | Status: AC
  Filled 2018-04-18: qty 2

## 2018-04-18 SURGICAL SUPPLY — 60 items
ADH SKN CLS APL DERMABOND .7 (GAUZE/BANDAGES/DRESSINGS) ×1
APL SKNCLS STERI-STRIP NONHPOA (GAUZE/BANDAGES/DRESSINGS) ×1
BAG DECANTER FOR FLEXI CONT (MISCELLANEOUS) ×3 IMPLANT
BENZOIN TINCTURE PRP APPL 2/3 (GAUZE/BANDAGES/DRESSINGS) ×3 IMPLANT
BLADE CLIPPER SURG (BLADE) IMPLANT
BUR CUTTER 7.0 ROUND (BURR) ×3 IMPLANT
CANISTER SUCT 3000ML PPV (MISCELLANEOUS) ×3 IMPLANT
CARTRIDGE OIL MAESTRO DRILL (MISCELLANEOUS) ×1 IMPLANT
CLOSURE WOUND 1/2 X4 (GAUZE/BANDAGES/DRESSINGS) ×1
COVER WAND RF STERILE (DRAPES) ×3 IMPLANT
DECANTER SPIKE VIAL GLASS SM (MISCELLANEOUS) ×3 IMPLANT
DERMABOND ADVANCED (GAUZE/BANDAGES/DRESSINGS) ×2
DERMABOND ADVANCED .7 DNX12 (GAUZE/BANDAGES/DRESSINGS) ×1 IMPLANT
DIFFUSER DRILL AIR PNEUMATIC (MISCELLANEOUS) ×3 IMPLANT
DRAPE HALF SHEET 40X57 (DRAPES) ×2 IMPLANT
DRAPE LAPAROTOMY 100X72X124 (DRAPES) ×3 IMPLANT
DRAPE MICROSCOPE LEICA (MISCELLANEOUS) ×3 IMPLANT
DRAPE SURG 17X23 STRL (DRAPES) ×6 IMPLANT
DRSG OPSITE POSTOP 3X4 (GAUZE/BANDAGES/DRESSINGS) ×2 IMPLANT
DRSG OPSITE POSTOP 4X6 (GAUZE/BANDAGES/DRESSINGS) IMPLANT
DURAPREP 26ML APPLICATOR (WOUND CARE) ×3 IMPLANT
ELECT REM PT RETURN 9FT ADLT (ELECTROSURGICAL) ×3
ELECTRODE REM PT RTRN 9FT ADLT (ELECTROSURGICAL) ×1 IMPLANT
GAUZE 4X4 16PLY RFD (DISPOSABLE) IMPLANT
GAUZE SPONGE 4X4 12PLY STRL (GAUZE/BANDAGES/DRESSINGS) ×1 IMPLANT
GLOVE BIO SURGEON STRL SZ 6.5 (GLOVE) ×1 IMPLANT
GLOVE BIO SURGEONS STRL SZ 6.5 (GLOVE) ×1
GLOVE BIOGEL PI IND STRL 6.5 (GLOVE) IMPLANT
GLOVE BIOGEL PI IND STRL 7.5 (GLOVE) IMPLANT
GLOVE BIOGEL PI IND STRL 8 (GLOVE) IMPLANT
GLOVE BIOGEL PI INDICATOR 6.5 (GLOVE) ×2
GLOVE BIOGEL PI INDICATOR 7.5 (GLOVE) ×2
GLOVE BIOGEL PI INDICATOR 8 (GLOVE) ×4
GLOVE ECLIPSE 7.5 STRL STRAW (GLOVE) ×6 IMPLANT
GLOVE ECLIPSE 9.0 STRL (GLOVE) ×3 IMPLANT
GLOVE EXAM NITRILE LRG STRL (GLOVE) IMPLANT
GLOVE EXAM NITRILE XL STR (GLOVE) IMPLANT
GLOVE EXAM NITRILE XS STR PU (GLOVE) IMPLANT
GOWN STRL REUS W/ TWL LRG LVL3 (GOWN DISPOSABLE) IMPLANT
GOWN STRL REUS W/ TWL XL LVL3 (GOWN DISPOSABLE) ×1 IMPLANT
GOWN STRL REUS W/TWL 2XL LVL3 (GOWN DISPOSABLE) ×2 IMPLANT
GOWN STRL REUS W/TWL LRG LVL3 (GOWN DISPOSABLE) ×3
GOWN STRL REUS W/TWL XL LVL3 (GOWN DISPOSABLE) ×3
KIT BASIN OR (CUSTOM PROCEDURE TRAY) ×3 IMPLANT
KIT TURNOVER KIT B (KITS) ×3 IMPLANT
NDL SPNL 22GX3.5 QUINCKE BK (NEEDLE) IMPLANT
NEEDLE HYPO 22GX1.5 SAFETY (NEEDLE) ×3 IMPLANT
NEEDLE SPNL 22GX3.5 QUINCKE BK (NEEDLE) ×3 IMPLANT
NS IRRIG 1000ML POUR BTL (IV SOLUTION) ×3 IMPLANT
OIL CARTRIDGE MAESTRO DRILL (MISCELLANEOUS) ×3
PACK LAMINECTOMY NEURO (CUSTOM PROCEDURE TRAY) ×3 IMPLANT
PAD ARMBOARD 7.5X6 YLW CONV (MISCELLANEOUS) ×9 IMPLANT
RUBBERBAND STERILE (MISCELLANEOUS) ×6 IMPLANT
SPONGE SURGIFOAM ABS GEL SZ50 (HEMOSTASIS) ×3 IMPLANT
STRIP CLOSURE SKIN 1/2X4 (GAUZE/BANDAGES/DRESSINGS) ×2 IMPLANT
SUT VIC AB 2-0 CT1 18 (SUTURE) ×3 IMPLANT
SUT VIC AB 3-0 SH 8-18 (SUTURE) ×3 IMPLANT
TOWEL GREEN STERILE (TOWEL DISPOSABLE) ×3 IMPLANT
TOWEL GREEN STERILE FF (TOWEL DISPOSABLE) ×3 IMPLANT
WATER STERILE IRR 1000ML POUR (IV SOLUTION) ×3 IMPLANT

## 2018-04-18 NOTE — Anesthesia Postprocedure Evaluation (Signed)
Anesthesia Post Note  Patient: [name]  Procedure(s) Performed: Microdiscectomy left - Lumbar five-Sacral one (Left Back)     Patient location during evaluation: PACU Anesthesia Type: General Level of consciousness: awake and alert Pain management: pain level controlled Vital Signs Assessment: post-procedure vital signs reviewed and stable Respiratory status: spontaneous breathing, nonlabored ventilation, respiratory function stable and patient connected to nasal cannula oxygen Cardiovascular status: blood pressure returned to baseline and stable Postop Assessment: no apparent nausea or vomiting Anesthetic complications: no    Last Vitals:  Vitals:   04/18/18 1220 04/18/18 1302  BP: 107/69 115/76  Pulse: 76 85  Resp: 16 18  Temp: (!) 36.3 C 37.2 C  SpO2: 95% 96%    Last Pain:  Vitals:   04/18/18 1302  TempSrc: Oral  PainSc:                  [name] S

## 2018-04-18 NOTE — Transfer of Care (Signed)
Immediate Anesthesia Transfer of Care Note  Patient: [name]  Procedure(s) Performed: Microdiscectomy left - Lumbar five-Sacral one (Left Back)  Patient Location: PACU  Anesthesia Type:General  Level of Consciousness: awake and alert   Airway & Oxygen Therapy: Patient Spontanous Breathing and Patient connected to face mask oxygen  Post-op Assessment: Report given to RN, Post -op Vital signs reviewed and stable, Patient moving all extremities and Patient moving all extremities X 4  Post vital signs: Reviewed and stable  Last Vitals:  Vitals Value Taken Time  BP 112/64 04/18/2018 11:36 AM  Temp    Pulse 110 04/18/2018 11:37 AM  Resp 19 04/18/2018 11:37 AM  SpO2 96 % 04/18/2018 11:37 AM  Vitals shown include unvalidated device data.  Last Pain:  Vitals:   04/18/18 0844  TempSrc:   PainSc: 7       Patients Stated Pain Goal: 4 (04/18/18 0844)  Complications: No apparent anesthesia complications

## 2018-04-18 NOTE — H&P (Signed)
  [name] is an 27 y.o. female.   Chief Complaint: Back and left leg pain HPI: 27 year old female with severe back and left lower extremity pain failing conservative management her work-up demonstrates evidence of a large left-sided L5-S1 disc herniation with caudal migration.  Patient presents now for laminotomy and microdiscectomy in hopes of improving her symptoms.  Past Medical History:  Diagnosis Date  . Asthma   . Bipolar 1 disorder (HCC)    " states she uded to be when she was a child"  . GERD (gastroesophageal reflux disease)     Past Surgical History:  Procedure Laterality Date  . MYRINGOTOMY Bilateral    as a child  . TONSILLECTOMY      History reviewed. No pertinent family history. Social History:  reports that she has been smoking cigarettes. She has a 14.00 pack-year smoking history. She has never used smokeless tobacco. She reports that she does not drink alcohol or use drugs.  Allergies:  Allergies  Allergen Reactions  . Trileptal [Oxcarbazepine] Nausea And Vomiting    Medications Prior to Admission  Medication Sig Dispense Refill  . PIRMELLA 1/35 tablet Take 1 tablet by mouth daily.  3  . albuterol (PROVENTIL HFA;VENTOLIN HFA) 108 (90 Base) MCG/ACT inhaler Inhale 2 puffs into the lungs every 6 (six) hours as needed for wheezing or shortness of breath.    Marland Kitchen ibuprofen (ADVIL,MOTRIN) 600 MG tablet Take 1 tablet (600 mg total) by mouth 4 (four) times daily. (Patient not taking: Reported on 04/01/2018) 30 tablet 0    Results for orders placed or performed during the hospital encounter of 04/18/18 (from the past 48 hour(s))  Pregnancy, urine POC     Status: None   Collection Time: 04/18/18  8:32 AM  Result Value Ref Range   Preg Test, Ur NEGATIVE NEGATIVE    Comment:        THE SENSITIVITY OF THIS METHODOLOGY IS >24 mIU/mL    No results found.  Pertinent items noted in HPI and remainder of comprehensive ROS otherwise negative.  Blood pressure 130/68,  pulse 94, temperature 98.1 F (36.7 C), temperature source Oral, resp. rate 20, height 5' 5.25" (1.657 m), weight 112.2 kg, last menstrual period 03/24/2018, SpO2 94 %, unknown if currently breastfeeding.  Patient is awake and alert.  She is oriented and appropriate.  Cranial nerve function is intact.  Speech is fluent.  Judgment insight reasonably intact.  Motor examination with mild weakness of her left-sided gastrocnemius muscle otherwise motor strength intact.  Sensory examination with decreased sensation pinprick light touch in her left S1 dermatome.  Deep tendon reflexes normal active except her left Achilles reflexes absent.  No evidence of long track signs.  Gait antalgic.  Examination head ears eyes nose throat is unremarkable her chest and abdomen are benign.  Extremities are free from injury deformity. 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  Plan left L5-S1 laminotomy and microdiscectomy.  Risks and benefits of been explained.  Patient wishes to proceed.  Sherilyn Cooter A Andrik Sandt 04/18/2018, 9:32 AM

## 2018-04-18 NOTE — Discharge Summary (Signed)
Physician Discharge Summary  Patient ID: [name] MRN: [id number] DOB/AGE: 1991/03/21 27 y.o.  Admit date: 04/18/2018 Discharge date: 04/18/2018  Admission Diagnoses:  Discharge Diagnoses:  Active Problems:   Lumbar radiculopathy   Discharged Condition: good  Hospital Course: Patient admitted to the hospital where she underwent uncomplicated laminotomy and microdiscectomy postop which she is doing well.  Preoperative back and lower extremity pain much improved.  Standing and walking without difficulty.  Ready for discharge home.  Consults:   Significant Diagnostic Studies:   Treatments:   Discharge Exam: Blood pressure 110/62, pulse 83, temperature 98.1 F (36.7 C), temperature source Oral, resp. rate 20, height 5' 5.25" (1.657 m), weight 112.2 kg, last menstrual period 03/24/2018, SpO2 94 %, unknown if currently breastfeeding. Awake and alert.  Oriented and appropriate.  Cranial nerve function intact.  Motor and sensory function extremities normal.  Wound clean and dry.  Chest and abdomen benign.  Disposition: Discharge disposition: 01-Home or Self Care        Allergies as of 04/18/2018      Reactions   Trileptal [oxcarbazepine] Nausea And Vomiting      Medication List    TAKE these medications   albuterol 108 (90 Base) MCG/ACT inhaler Commonly known as:  PROVENTIL HFA;VENTOLIN HFA Inhale 2 puffs into the lungs every 6 (six) hours as needed for wheezing or shortness of breath.   cyclobenzaprine 10 MG tablet Commonly known as:  FLEXERIL Take 1 tablet (10 mg total) by mouth 3 (three) times daily as needed for muscle spasms.   HYDROcodone-acetaminophen 5-325 MG tablet Commonly known as:  NORCO/VICODIN Take 1-2 tablets by mouth every 4 (four) hours as needed for moderate pain ((score 4 to 6)).   ibuprofen 600 MG tablet Commonly known as:  ADVIL,MOTRIN Take 1 tablet (600 mg total) by mouth 4 (four) times daily.   PIRMELLA 1/35 tablet Generic drug:   norethindrone-ethinyl estradiol 1/35 Take 1 tablet by mouth daily.        Signed: Kathaleen Maser [name] 04/18/2018, 5:53 PM

## 2018-04-18 NOTE — Progress Notes (Signed)
Patient is discharged from room 3C08 at this time. Alert and in stable condition. IV site d/c'd and instructions read to patient and family with understanding verbalized. Left unit via wheelchair with all belongings at side. 

## 2018-04-18 NOTE — Brief Op Note (Signed)
04/18/2018  11:19 AM  PATIENT:  Kyra Searles Constante  27 y.o. female  PRE-OPERATIVE DIAGNOSIS:  Herniated Nucleus Pulposus  POST-OPERATIVE DIAGNOSIS:  Herniated Nucleus Pulposus  PROCEDURE:  Procedure(s): Microdiscectomy left - Lumbar five-Sacral one (Left)  SURGEON:  Surgeon(s) and Role:    Julio Sicks, MD - Primary  PHYSICIAN ASSISTANT:   ASSISTANTSDoran Durand, NP   ANESTHESIA:   general  EBL:  100 cc   BLOOD ADMINISTERED:none  DRAINS: none   LOCAL MEDICATIONS USED:  MARCAINE     SPECIMEN:  No Specimen  DISPOSITION OF SPECIMEN:  N/A  COUNTS:  YES  TOURNIQUET:  * No tourniquets in log *  DICTATION: .Dragon Dictation  PLAN OF CARE: Admit to inpatient   PATIENT DISPOSITION:  PACU - hemodynamically stable.   Delay start of Pharmacological VTE agent (>24hrs) due to surgical blood loss or risk of bleeding: yes

## 2018-04-18 NOTE — Anesthesia Preprocedure Evaluation (Signed)
Anesthesia Evaluation  Patient identified by MRN, date of birth, ID band Patient awake    Reviewed: Allergy & Precautions, H&P , NPO status , Patient's Chart, lab work & pertinent test results  Airway Mallampati: II   Neck ROM: full    Dental   Pulmonary asthma , Current Smoker,    breath sounds clear to auscultation       Cardiovascular negative cardio ROS   Rhythm:regular Rate:Normal     Neuro/Psych PSYCHIATRIC DISORDERS Bipolar Disorder    GI/Hepatic GERD  ,  Endo/Other    Renal/GU      Musculoskeletal   Abdominal   Peds  Hematology   Anesthesia Other Findings   Reproductive/Obstetrics                             Anesthesia Physical Anesthesia Plan  ASA: II  Anesthesia Plan: General   Post-op Pain Management:    Induction: Intravenous  PONV Risk Score and Plan: 2 and Ondansetron, Dexamethasone, Midazolam and Treatment may vary due to age or medical condition  Airway Management Planned: Oral ETT  Additional Equipment:   Intra-op Plan:   Post-operative Plan: Extubation in OR  Informed Consent: I have reviewed the patients History and Physical, chart, labs and discussed the procedure including the risks, benefits and alternatives for the proposed anesthesia with the patient or authorized representative who has indicated his/her understanding and acceptance.     Plan Discussed with: CRNA, Anesthesiologist and Surgeon  Anesthesia Plan Comments:         Anesthesia Quick Evaluation

## 2018-04-18 NOTE — Evaluation (Signed)
Occupational Therapy Evaluation Patient Details Name: [name] MRN: [id number] DOB: 1991-04-13 Today's Date: 04/18/2018    History of Present Illness  27 yo female s/p Left L5-S1 laminotomy and microdiscectomy. PMH including biploar, asthma, and GERD.   Clinical Impression   PTA, pt was living with her mother and fiance and was independent. Currently, pt performed ADLs and functional mobility at supervision level. Provided education and handout on back precautions, bed mobility, LB ADLs, toileting, and tub transfer; pt demonstrated understanding. Answered all pt questions. Recommend dc home once medically stable per physician. All acute OT needs met and will sign off. Thank you.     Follow Up Recommendations  No OT follow up;Supervision/Assistance - 24 hour    Equipment Recommendations  None recommended by OT    Recommendations for Other Services       Precautions / Restrictions Precautions Precautions: Back Precaution Booklet Issued: Yes (comment) Precaution Comments: Reviewed back precautions Required Braces or Orthoses: Other Brace/Splint Other Brace/Splint: No brace per MD Restrictions Weight Bearing Restrictions: No      Mobility Bed Mobility Overal bed mobility: Needs Assistance Bed Mobility: Rolling;Sidelying to Sit Rolling: Supervision Sidelying to sit: Supervision       General bed mobility comments: Education on log roll technique. Supervision for first time OOB  Transfers Overall transfer level: Needs assistance Equipment used: None Transfers: Sit to/from Stand Sit to Stand: Supervision         General transfer comment: supervision for safety    Balance Overall balance assessment: No apparent balance deficits (not formally assessed)                                         ADL either performed or assessed with clinical judgement   ADL Overall ADL's : Needs assistance/impaired                                        General ADL Comments: Providing education on back precautions, bed mobility, grooming, LB ADLs, toileting, and tub transfer. Pt demonstrating understanding. Pt performing ADLs and functional mobility at supervision level for safety.     Vision         Perception     Praxis      Pertinent Vitals/Pain Pain Assessment: Faces Faces Pain Scale: Hurts a little bit Pain Location: Back Pain Descriptors / Indicators: Constant;Discomfort Pain Intervention(s): Monitored during session;Repositioned     Hand Dominance     Extremity/Trunk Assessment Upper Extremity Assessment Upper Extremity Assessment: Overall WFL for tasks assessed   Lower Extremity Assessment Lower Extremity Assessment: Overall WFL for tasks assessed   Cervical / Trunk Assessment Cervical / Trunk Assessment: Other exceptions Cervical / Trunk Exceptions: s/p  27 yo female s/p Left L5-S1 laminotomy and microdiscectomy.   Communication Communication Communication: No difficulties   Cognition Arousal/Alertness: Awake/alert Behavior During Therapy: WFL for tasks assessed/performed Overall Cognitive Status: Within Functional Limits for tasks assessed                                     General Comments  Mother and fiance present during session    Exercises     Shoulder Instructions      Home Living Family/patient expects to  be discharged to:: Private residence Living Arrangements: Parent;Spouse/significant other;Children Available Help at Discharge: Family;Available 24 hours/day(Mother, fiance, and two children (2 yo and 18 months)) Type of Home: House Home Access: Stairs to enter Technical brewer of Steps: 3   Home Layout: One level     Bathroom Shower/Tub: Teacher, early years/pre: Standard     Home Equipment: None          Prior Functioning/Environment Level of Independence: Independent        Comments: ADLs, IADLs, and stay at home mom         OT Problem List: Impaired balance (sitting and/or standing);Pain;Decreased range of motion;Decreased activity tolerance;Decreased knowledge of precautions      OT Treatment/Interventions:      OT Goals(Current goals can be found in the care plan section) Acute Rehab OT Goals Patient Stated Goal: "Go home today" OT Goal Formulation: All assessment and education complete, DC therapy  OT Frequency:     Barriers to D/C:            Co-evaluation              AM-PAC PT "6 Clicks" Daily Activity     Outcome Measure Help from another person eating meals?: None Help from another person taking care of personal grooming?: None Help from another person toileting, which includes using toliet, bedpan, or urinal?: None Help from another person bathing (including washing, rinsing, drying)?: None Help from another person to put on and taking off regular upper body clothing?: None Help from another person to put on and taking off regular lower body clothing?: None 6 Click Score: 24   End of Session Nurse Communication: Mobility status;Precautions  Activity Tolerance: Patient tolerated treatment well Patient left: in chair  OT Visit Diagnosis: Other abnormalities of gait and mobility (R26.89);Pain Pain - part of body: (Back)                Time: 6213-0865 OT Time Calculation (min): 15 min Charges:  OT General Charges $OT Visit: 1 Visit OT Evaluation $OT Eval Low Complexity: 1 Low  Bexley Laubach MSOT, OTR/L Acute Rehab Pager: [phone] Office: [place] 04/18/2018, 3:13 PM

## 2018-04-18 NOTE — Discharge Instructions (Signed)

## 2018-04-18 NOTE — Anesthesia Procedure Notes (Signed)
Procedure Name: Intubation Date/Time: 04/18/2018 10:13 AM Performed by: Achille Rich, MD Pre-anesthesia Checklist: Patient identified, Suction available, Emergency Drugs available and Patient being monitored Patient Re-evaluated:Patient Re-evaluated prior to induction Oxygen Delivery Method: Circle system utilized Preoxygenation: Pre-oxygenation with 100% oxygen Induction Type: IV induction Ventilation: Mask ventilation without difficulty Laryngoscope Size: Miller and 2 Grade View: Grade I Tube type: Oral Tube size: 7.0 mm Number of attempts: 1 Airway Equipment and Method: Stylet Placement Confirmation: ETT inserted through vocal cords under direct vision and positive ETCO2 Secured at: 23 cm Tube secured with: Tape Dental Injury: Teeth and Oropharynx as per pre-operative assessment

## 2018-04-18 NOTE — Op Note (Signed)
Date of procedure: 04/18/2018  Date of dictation: Same  Service: Neurosurgery  Preoperative diagnosis: Left L5-S1 herniated nucleus pulposus with radiculopathy  Postoperative diagnosis: Same  Procedure Name: Left L5-S1 laminotomy and microdiscectomy  Surgeon:Milus Fritze A.Amal Saiki, M.D.  Asst. Surgeon: Doran Durand, NP  Anesthesia: General  Indication: 27 year old female with back and left lower extremity pain paresthesias weakness and some left-sided S1 radiculopathy.  Work-up demonstrates evidence of a large left-sided L5-S1 disc herniation with a caudally migrated fragment causing marked compression of the left-sided S1 nerve root.  Patient presents now for laminotomy and microdiscectomy in hopes of improving her symptoms.  Operative note: After induction anesthesia, patient position prone on the Wilson frame and properly padded.  Lumbar region prepped and draped sterilely.  Incision made overlying L5-S1.  Dissection performed on the left.  Retractor placed.  X-ray taken.  Level confirmed.  Laminotomy then performed using high-speed drill and Kerrison Rogers.  Ligament flavum elevated and resected.  Underlying thecal sac and left S1 nerve root identified.  Microscope brought in field using microdissection spinal canal.  Epidural venous plexus coagulated and cut.  Thecal sac and S1 nerve root gently mobilized and retracted medially.  Disc space and disc fragment were identified.  These were dissected free using blunt nerve hooks.  Large amount of freely herniated disc material was removed.  The disc space was entered.  All loose or obviously degenerative disc dura was removed the interspace.  At this point a very thorough decompression of been achieved.  There was no evidence of injury to the thecal sac or nerve roots.  Wound was then irrigated with fanlike solution.  Gelfoam was placed topically for hemostasis.  Wounds and closed in layers of Vicryl sutures.  Steri-Strips and sterile dressing were applied.   No apparent complications.  Patient tolerated the procedure well and she returns to the recovery room postop.

## 2018-04-19 ENCOUNTER — Encounter (HOSPITAL_COMMUNITY): Payer: Self-pay | Admitting: Neurosurgery

## 2018-05-11 ENCOUNTER — Other Ambulatory Visit: Payer: Self-pay

## 2019-06-09 ENCOUNTER — Encounter (HOSPITAL_COMMUNITY): Payer: Self-pay

## 2019-06-09 ENCOUNTER — Other Ambulatory Visit: Payer: Self-pay

## 2019-06-09 DIAGNOSIS — H5711 Ocular pain, right eye: Secondary | ICD-10-CM | POA: Diagnosis present

## 2019-06-09 DIAGNOSIS — Z5321 Procedure and treatment not carried out due to patient leaving prior to being seen by health care provider: Secondary | ICD-10-CM | POA: Diagnosis not present

## 2019-06-09 NOTE — ED Triage Notes (Signed)
Pt arrives via POV from home c/o right eye problem that began yesterday but has been progressively getting worse. Pt reports she was unable to open eye this morning but applied eye drop solution to help relieve symptom. Pt reports smoking cigarette yesterday when pain began.

## 2019-06-09 NOTE — ED Triage Notes (Signed)
Pts right eye is pink on presentation and Pt reports it feels like something might be in her eye.

## 2019-06-09 NOTE — ED Notes (Signed)
Pt waiting in vehicle. Asked to be called Pt at [phone] when bed is made ready.

## 2019-06-10 ENCOUNTER — Emergency Department (HOSPITAL_COMMUNITY)
Admission: EM | Admit: 2019-06-10 | Discharge: 2019-06-10 | Disposition: A | Discharge status: Home / Self Care | Payer: Medicaid Other | Attending: Emergency Medicine | Admitting: Emergency Medicine

## 2020-03-29 IMAGING — CR DG LUMBAR SPINE 1V
1 series · 1 of 1 positions shown · non-contrast
Comparison: 03/15/2018.

CLINICAL DATA: 26-year-old female for L5-S1 discectomy. Subsequent
encounter.

EXAM:
LUMBAR SPINE - 1 VIEW

[lateral]
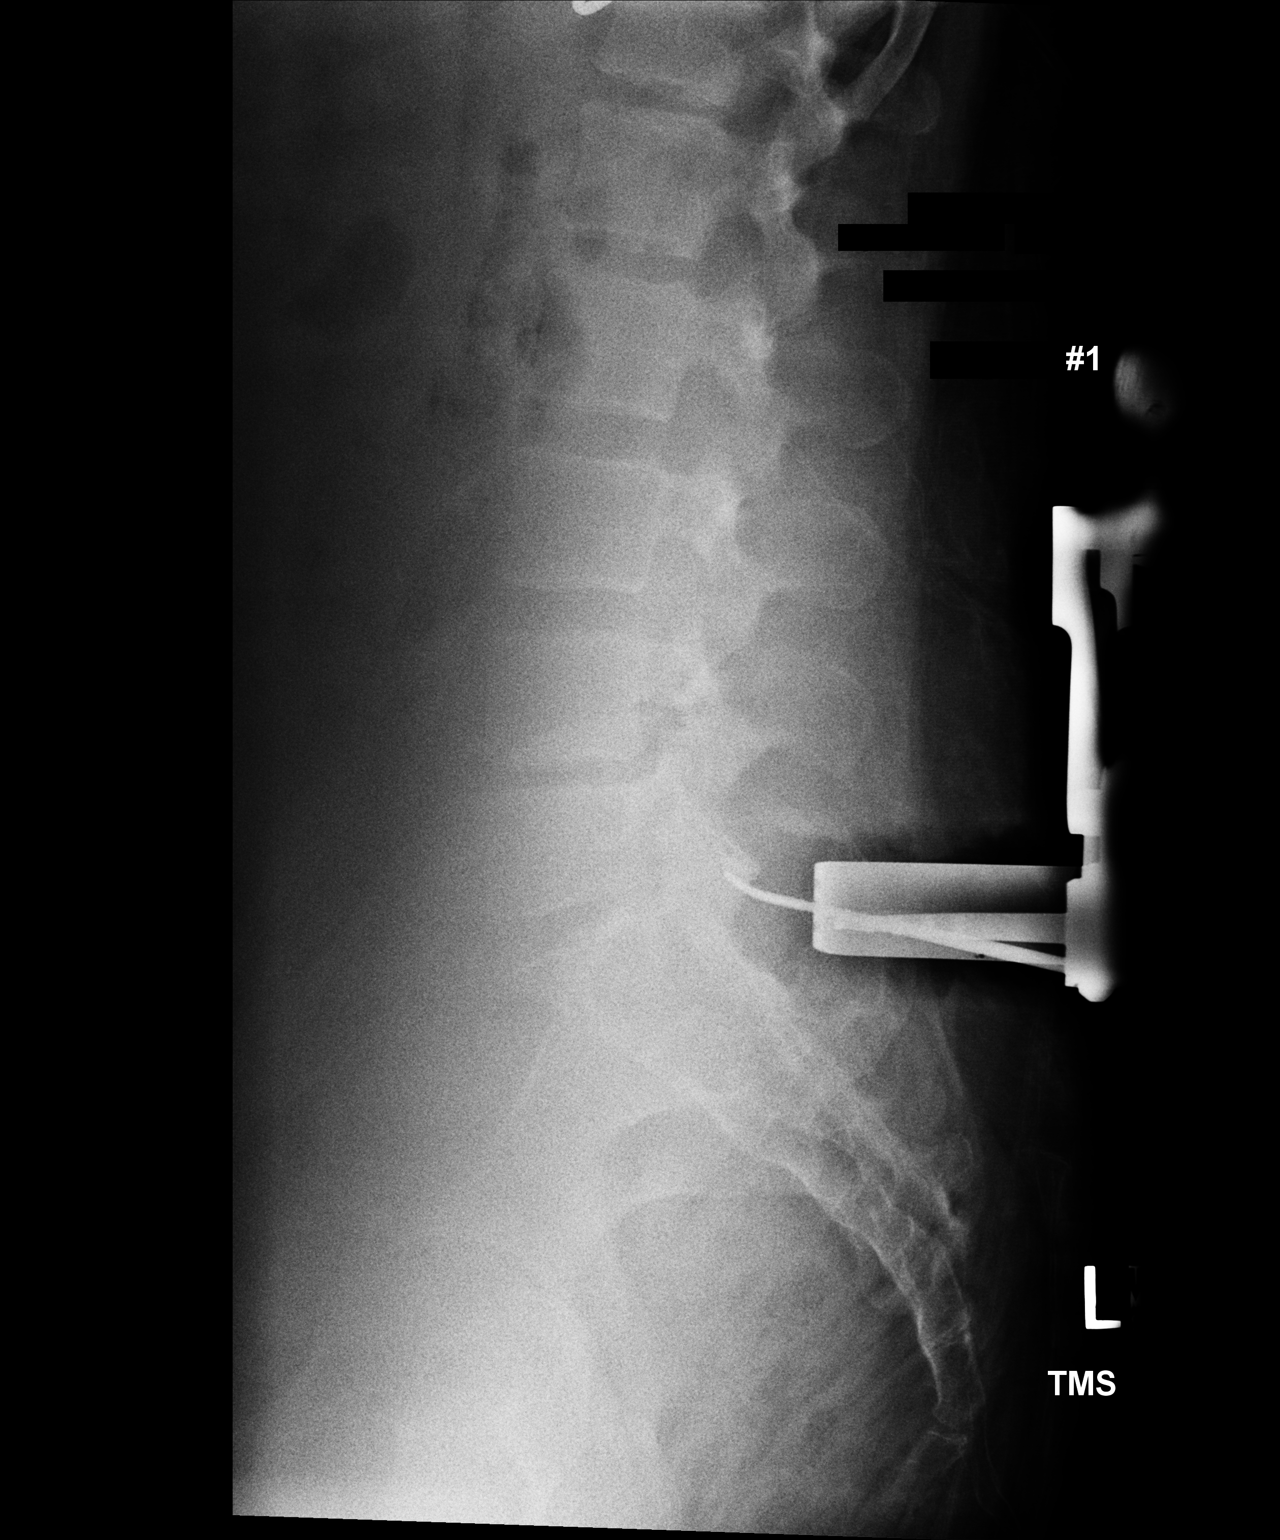

[1 of 1 positions shown; findings below may reference images not displayed]

FINDINGS: Per prior MR level assignment, last fully open disc space is labeled
the L5-S1 level.

Intraoperative lateral view of the lumbar spine reveals metallic
probe posterior to the L5-S1 level.
IMPRESSION: Localization L5-S1.

## 2022-12-21 ENCOUNTER — Encounter (HOSPITAL_COMMUNITY): Payer: Self-pay | Admitting: Emergency Medicine

## 2022-12-21 ENCOUNTER — Emergency Department (HOSPITAL_COMMUNITY)
Admission: EM | Admit: 2022-12-21 | Discharge: 2022-12-21 | Disposition: A | Discharge status: Home / Self Care | Payer: Medicaid Other | Attending: Emergency Medicine | Admitting: Emergency Medicine

## 2022-12-21 ENCOUNTER — Other Ambulatory Visit: Payer: Self-pay

## 2022-12-21 DIAGNOSIS — H9202 Otalgia, left ear: Secondary | ICD-10-CM | POA: Insufficient documentation

## 2022-12-21 MED ORDER — AMOXICILLIN 500 MG PO CAPS: 500.0000 mg | ORAL_CAPSULE | Freq: Three times a day (TID) | ORAL | 0 refills | Status: DC

## 2022-12-21 NOTE — ED Triage Notes (Signed)
Pt with c/o L ear pain x 2 weeks.

## 2022-12-21 NOTE — ED Provider Notes (Signed)
   EMERGENCY DEPARTMENT AT [facility] Provider Note   CSN: [id number] Arrival date & time: 12/21/22  0145     History  Chief Complaint  Patient presents with   Otalgia    [name] is a 32 y.o. female.  The history is provided by the patient.  Patient reports left ear pain for weeks.  She reports it is now draining.  No trauma.  She reports she went swimming in the river yesterday, but the pain and drainage started before this.  Distant history of tympanostomy tubes     Home Medications Prior to Admission medications   Medication Sig Start Date End Date Taking? Authorizing Provider  amoxicillin (AMOXIL) 500 MG capsule Take 1 capsule (500 mg total) by mouth 3 (three) times daily. 12/21/22  Yes Zadie Rhine, MD  albuterol (PROVENTIL HFA;VENTOLIN HFA) 108 (90 Base) MCG/ACT inhaler Inhale 2 puffs into the lungs every 6 (six) hours as needed for wheezing or shortness of breath.    [provider]      Allergies    Trileptal [oxcarbazepine]    Review of Systems   Review of Systems  Physical Exam Updated Vital Signs BP 128/80 (BP Location: Left Arm)   Pulse 85   Temp 98.6 F (37 C) (Oral)   Resp 16   Ht 1.702 m (5\' 7" )   Wt 104.3 kg   LMP  (LMP Unknown)   SpO2 97%   BMI 36.02 kg/m  Physical Exam CONSTITUTIONAL: Well developed/well nourished HEAD: Normocephalic/atraumatic ENMT: Mucous membranes moist The ears are symmetric Right TM is clear and intact. Left TM is obscured due to discharge. NEURO: Pt is awake/alert/appropriate, moves all extremitiesx4.  No facial droop.   EXTREMITIES:  full ROM SKIN: warm, color normal PSYCH: no abnormalities of mood noted, alert and oriented to situation  ED Results / Procedures / Treatments   Labs (all labs ordered are listed, but only abnormal results are displayed) Labs Reviewed - No data to display  EKG None  Radiology No results found.  Procedures Procedures    Medications  Ordered in ED Medications - No data to display  ED Course/ Medical Decision Making/ A&P                             Medical Decision Making Risk Prescription drug management.   Will start oral antibiotics.  It is possible patient had OM with a ruptured tympanic membrane.  If no improvement in a week, she will need to have a recheck        Final Clinical Impression(s) / ED Diagnoses Final diagnoses:  Left ear pain    Rx / DC Orders ED Discharge Orders          Ordered    amoxicillin (AMOXIL) 500 MG capsule  3 times daily        12/21/22 0216              Zadie Rhine, MD 12/21/22 [phone]

## 2023-01-13 ENCOUNTER — Emergency Department (HOSPITAL_COMMUNITY)
Admission: EM | Admit: 2023-01-13 | Discharge: 2023-01-14 | Disposition: A | Discharge status: Home / Self Care | Payer: Self-pay | Attending: Emergency Medicine | Admitting: Emergency Medicine

## 2023-01-13 ENCOUNTER — Other Ambulatory Visit: Payer: Self-pay

## 2023-01-13 ENCOUNTER — Encounter (HOSPITAL_COMMUNITY): Payer: Self-pay

## 2023-01-13 DIAGNOSIS — R000 Tachycardia, unspecified: Secondary | ICD-10-CM | POA: Insufficient documentation

## 2023-01-13 DIAGNOSIS — N12 Tubulo-interstitial nephritis, not specified as acute or chronic: Secondary | ICD-10-CM | POA: Insufficient documentation

## 2023-01-13 MED ORDER — FENTANYL CITRATE PF 50 MCG/ML IJ SOSY
50.0000 ug | PREFILLED_SYRINGE | Freq: Once | INTRAMUSCULAR | Status: AC
  Administered 2023-01-13: 50 ug via INTRAVENOUS
  Filled 2023-01-13: qty 1

## 2023-01-13 MED ORDER — LACTATED RINGERS IV BOLUS
1000.0000 mL | Freq: Once | INTRAVENOUS | Status: AC
  Administered 2023-01-14: 1000 mL via INTRAVENOUS

## 2023-01-13 NOTE — ED Triage Notes (Signed)
Pt c/o left flank pain that radiates to the back x2 days. Pt endorses dysuria but denies N/V.

## 2023-01-14 ENCOUNTER — Emergency Department (HOSPITAL_COMMUNITY): Payer: Self-pay

## 2023-01-14 ENCOUNTER — Encounter (HOSPITAL_COMMUNITY): Payer: Self-pay

## 2023-01-14 ENCOUNTER — Inpatient Hospital Stay (HOSPITAL_COMMUNITY)
Admission: EM | Admit: 2023-01-14 | Discharge: 2023-01-16 | DRG: 690 | Disposition: A | Discharge status: Home / Self Care | Payer: Self-pay | Attending: Family Medicine | Admitting: Family Medicine

## 2023-01-14 ENCOUNTER — Other Ambulatory Visit: Payer: Self-pay

## 2023-01-14 DIAGNOSIS — R300 Dysuria: Secondary | ICD-10-CM | POA: Diagnosis present

## 2023-01-14 DIAGNOSIS — N10 Acute tubulo-interstitial nephritis: Principal | ICD-10-CM | POA: Diagnosis present

## 2023-01-14 DIAGNOSIS — E876 Hypokalemia: Secondary | ICD-10-CM | POA: Diagnosis present

## 2023-01-14 DIAGNOSIS — R7881 Bacteremia: Secondary | ICD-10-CM | POA: Diagnosis present

## 2023-01-14 DIAGNOSIS — R112 Nausea with vomiting, unspecified: Secondary | ICD-10-CM | POA: Diagnosis present

## 2023-01-14 DIAGNOSIS — F319 Bipolar disorder, unspecified: Secondary | ICD-10-CM | POA: Diagnosis present

## 2023-01-14 DIAGNOSIS — K439 Ventral hernia without obstruction or gangrene: Secondary | ICD-10-CM | POA: Diagnosis present

## 2023-01-14 DIAGNOSIS — Z6837 Body mass index (BMI) 37.0-37.9, adult: Secondary | ICD-10-CM

## 2023-01-14 DIAGNOSIS — F17200 Nicotine dependence, unspecified, uncomplicated: Secondary | ICD-10-CM | POA: Insufficient documentation

## 2023-01-14 DIAGNOSIS — K219 Gastro-esophageal reflux disease without esophagitis: Secondary | ICD-10-CM | POA: Diagnosis present

## 2023-01-14 DIAGNOSIS — E669 Obesity, unspecified: Secondary | ICD-10-CM | POA: Diagnosis present

## 2023-01-14 DIAGNOSIS — N12 Tubulo-interstitial nephritis, not specified as acute or chronic: Principal | ICD-10-CM

## 2023-01-14 DIAGNOSIS — B9620 Unspecified Escherichia coli [E. coli] as the cause of diseases classified elsewhere: Secondary | ICD-10-CM | POA: Diagnosis present

## 2023-01-14 DIAGNOSIS — F17210 Nicotine dependence, cigarettes, uncomplicated: Secondary | ICD-10-CM | POA: Diagnosis present

## 2023-01-14 DIAGNOSIS — J45909 Unspecified asthma, uncomplicated: Secondary | ICD-10-CM | POA: Diagnosis present

## 2023-01-14 DIAGNOSIS — Z888 Allergy status to other drugs, medicaments and biological substances status: Secondary | ICD-10-CM

## 2023-01-14 DIAGNOSIS — Z28310 Unvaccinated for covid-19: Secondary | ICD-10-CM

## 2023-01-14 LAB — COMPREHENSIVE METABOLIC PANEL
ALT: 16 U/L (ref 0–44)
AST: 16 U/L (ref 15–41)
Albumin: 3.7 g/dL (ref 3.5–5.0)
Alkaline Phosphatase: 127 U/L — ABNORMAL HIGH (ref 38–126)
Anion gap: 9 (ref 5–15)
BUN: 8 mg/dL (ref 6–20)
CO2: 24 mmol/L (ref 22–32)
Calcium: 8.8 mg/dL — ABNORMAL LOW (ref 8.9–10.3)
Chloride: 101 mmol/L (ref 98–111)
Creatinine, Ser: 0.66 mg/dL (ref 0.44–1.00)
GFR, Estimated: >60 mL/min (ref >60)
Glucose, Bld: 112 mg/dL — ABNORMAL HIGH (ref 70–99)
Potassium: 3.7 mmol/L (ref 3.5–5.1)
Sodium: 134 mmol/L — ABNORMAL LOW (ref 135–145)
Total Bilirubin: 0.8 mg/dL (ref 0.3–1.2)
Total Protein: 7.6 g/dL (ref 6.5–8.1)

## 2023-01-14 LAB — URINALYSIS, W/ REFLEX TO CULTURE (INFECTION SUSPECTED)
Bilirubin Urine: NEGATIVE
Glucose, UA: NEGATIVE mg/dL
Ketones, ur: NEGATIVE mg/dL
Nitrite: NEGATIVE
Protein, ur: NEGATIVE mg/dL
Specific Gravity, Urine: 1.002 — ABNORMAL LOW (ref 1.005–1.030)
WBC, UA: >50 WBC/hpf (ref 0–5)
pH: 7 (ref 5.0–8.0)

## 2023-01-14 LAB — BASIC METABOLIC PANEL
Anion gap: 10 (ref 5–15)
BUN: 8 mg/dL (ref 6–20)
CO2: 26 mmol/L (ref 22–32)
Calcium: 8.7 mg/dL — ABNORMAL LOW (ref 8.9–10.3)
Chloride: 101 mmol/L (ref 98–111)
Creatinine, Ser: 0.64 mg/dL (ref 0.44–1.00)
GFR, Estimated: >60 mL/min (ref >60)
Glucose, Bld: 96 mg/dL (ref 70–99)
Potassium: 3.3 mmol/L — ABNORMAL LOW (ref 3.5–5.1)
Sodium: 137 mmol/L (ref 135–145)

## 2023-01-14 LAB — BLOOD CULTURE ID PANEL (REFLEXED) - BCID2

## 2023-01-14 LAB — CBC WITH DIFFERENTIAL/PLATELET
Abs Immature Granulocytes: 0.08 10*3/uL — ABNORMAL HIGH (ref 0.00–0.07)
Basophils Absolute: 0 10*3/uL (ref 0.0–0.1)
Basophils Relative: 0 %
Eosinophils Absolute: 0.2 10*3/uL (ref 0.0–0.5)
Eosinophils Relative: 2 %
HCT: 47.4 % — ABNORMAL HIGH (ref 36.0–46.0)
Hemoglobin: 16.1 g/dL — ABNORMAL HIGH (ref 12.0–15.0)
Immature Granulocytes: 1 %
Lymphocytes Relative: 27 %
Lymphs Abs: 2.7 10*3/uL (ref 0.7–4.0)
MCH: 32.8 pg (ref 26.0–34.0)
MCHC: 34 g/dL (ref 30.0–36.0)
MCV: 96.5 fL (ref 80.0–100.0)
Monocytes Absolute: 0.9 10*3/uL (ref 0.1–1.0)
Monocytes Relative: 9 %
Neutro Abs: 6.2 10*3/uL (ref 1.7–7.7)
Neutrophils Relative %: 61 %
Platelets: 216 10*3/uL (ref 150–400)
RBC: 4.91 MIL/uL (ref 3.87–5.11)
RDW: 14.3 % (ref 11.5–15.5)
WBC: 10 10*3/uL (ref 4.0–10.5)
nRBC: 0 % (ref 0.0–0.2)

## 2023-01-14 LAB — CBC
HCT: 43.4 % (ref 36.0–46.0)
Hemoglobin: 14.9 g/dL (ref 12.0–15.0)
MCH: 32.6 pg (ref 26.0–34.0)
MCHC: 34.3 g/dL (ref 30.0–36.0)
MCV: 95 fL (ref 80.0–100.0)
Platelets: 210 10*3/uL (ref 150–400)
RBC: 4.57 MIL/uL (ref 3.87–5.11)
RDW: 14.4 % (ref 11.5–15.5)
WBC: 11.3 10*3/uL — ABNORMAL HIGH (ref 4.0–10.5)
nRBC: 0 % (ref 0.0–0.2)

## 2023-01-14 LAB — LIPASE, BLOOD: Lipase: 38 U/L (ref 11–51)

## 2023-01-14 LAB — LACTIC ACID, PLASMA
Lactic Acid, Venous: 1.6 mmol/L (ref 0.5–1.9)
Lactic Acid, Venous: 0.9 mmol/L (ref 0.5–1.9)

## 2023-01-14 LAB — PREGNANCY, URINE: Preg Test, Ur: NEGATIVE

## 2023-01-14 MED ORDER — IOHEXOL 300 MG/ML  SOLN
100.0000 mL | Freq: Once | INTRAMUSCULAR | Status: AC | PRN
  Administered 2023-01-14: 100 mL via INTRAVENOUS

## 2023-01-14 MED ORDER — SODIUM CHLORIDE 0.9 % IV BOLUS (SEPSIS)
1000.0000 mL | Freq: Once | INTRAVENOUS | Status: AC
  Administered 2023-01-14: 1000 mL via INTRAVENOUS

## 2023-01-14 MED ORDER — ACETAMINOPHEN 500 MG PO TABS
1000.0000 mg | ORAL_TABLET | Freq: Once | ORAL | Status: AC
  Administered 2023-01-14: 1000 mg via ORAL
  Filled 2023-01-14: qty 2

## 2023-01-14 MED ORDER — SODIUM CHLORIDE 0.9 % IV SOLN
2.0000 g | Freq: Once | INTRAVENOUS | Status: AC
  Administered 2023-01-14: 2 g via INTRAVENOUS
  Filled 2023-01-14: qty 20

## 2023-01-14 MED ORDER — SODIUM CHLORIDE 0.9 % IV SOLN
1.0000 g | Freq: Once | INTRAVENOUS | Status: AC
  Administered 2023-01-14: 1 g via INTRAVENOUS
  Filled 2023-01-14: qty 10

## 2023-01-14 MED ORDER — OXYCODONE-ACETAMINOPHEN 5-325 MG PO TABS
2.0000 | ORAL_TABLET | Freq: Once | ORAL | Status: AC
  Administered 2023-01-14: 2 via ORAL
  Filled 2023-01-14: qty 2

## 2023-01-14 MED ORDER — ONDANSETRON 4 MG PO TBDP: ORAL_TABLET | ORAL | 0 refills | Status: AC

## 2023-01-14 MED ORDER — SODIUM CHLORIDE 0.9 % IV SOLN
1000.0000 mL | INTRAVENOUS | Status: DC
  Administered 2023-01-15: 1000 mL via INTRAVENOUS

## 2023-01-14 MED ORDER — LACTATED RINGERS IV BOLUS
1000.0000 mL | Freq: Once | INTRAVENOUS | Status: AC
  Administered 2023-01-14: 1000 mL via INTRAVENOUS

## 2023-01-14 MED ORDER — IBUPROFEN 800 MG PO TABS
800.0000 mg | ORAL_TABLET | Freq: Once | ORAL | Status: AC
  Administered 2023-01-14: 800 mg via ORAL
  Filled 2023-01-14: qty 1

## 2023-01-14 MED ORDER — CEPHALEXIN 500 MG PO CAPS: 500.0000 mg | ORAL_CAPSULE | Freq: Four times a day (QID) | ORAL | 0 refills | Status: DC

## 2023-01-14 MED ORDER — OXYCODONE-ACETAMINOPHEN 5-325 MG PO TABS: 1.0000 | ORAL_TABLET | Freq: Three times a day (TID) | ORAL | 0 refills | Status: AC | PRN

## 2023-01-14 NOTE — ED Provider Notes (Addendum)
[place] EMERGENCY DEPARTMENT AT [facility] Provider Note   CSN: [id number] Arrival date & time: 01/13/23  2311     History  Chief Complaint  Patient presents with   Flank Pain    [name] is a 32 y.o. female.  Patient presents the ER today with right-sided back pain.  Patient states that this started over the last day or 2.  She is also had some dysuria for the last few days as well with decreased urine output.  She states no fevers but she feels really hot.  No nausea or vomiting.  No pain on the left side.  Has had UTIs before but never had Pilo nephritis.  She did have an ear infection about a month ago which has been treated fully.  No sick contacts.  No trauma.  She states that she has had decreased intake for water and food recently secondary to a break-up and she has drank quite a bit of beer during that time as well.  She thinks over the last couple days that she is probably kept up pretty well on a water intake.   Flank Pain   Home Medications Prior to Admission medications   Medication Sig Start Date End Date Taking? Authorizing Provider  cephALEXin (KEFLEX) 500 MG capsule Take 1 capsule (500 mg total) by mouth 4 (four) times daily for 14 days. 01/14/23 01/28/23 Yes Ettie Krontz, Barbara Cower, MD  ondansetron (ZOFRAN-ODT) 4 MG disintegrating tablet 4mg  ODT q4 hours prn nausea/vomit 01/14/23  Yes Wilbur Labuda, Barbara Cower, MD  oxyCODONE-acetaminophen (PERCOCET) 5-325 MG tablet Take 1-2 tablets by mouth every 8 (eight) hours as needed. 01/14/23  Yes Shaasia Odle, Barbara Cower, MD  albuterol (PROVENTIL HFA;VENTOLIN HFA) 108 (90 Base) MCG/ACT inhaler Inhale 2 puffs into the lungs every 6 (six) hours as needed for wheezing or shortness of breath.    [provider]  amoxicillin (AMOXIL) 500 MG capsule Take 1 capsule (500 mg total) by mouth 3 (three) times daily. 12/21/22   Zadie Rhine, MD      Allergies    Trileptal [oxcarbazepine]    Review of Systems   Review of Systems   Genitourinary:  Positive for flank pain.    Physical Exam Updated Vital Signs BP (!) 100/52   Pulse (!) 130   Temp (!) 102.1 F (38.9 C) (Rectal)   Resp 17   LMP  (LMP Unknown)   SpO2 100%  Physical Exam Vitals and nursing note reviewed.  Constitutional:      Appearance: She is well-developed.  HENT:     Head: Normocephalic and atraumatic.     Nose: No rhinorrhea.     Mouth/Throat:     Mouth: Mucous membranes are dry.  Eyes:     Pupils: Pupils are equal, round, and reactive to light.  Cardiovascular:     Rate and Rhythm: Regular rhythm. Tachycardia present.  Pulmonary:     Effort: No respiratory distress.     Breath sounds: No stridor.  Abdominal:     General: There is no distension.  Musculoskeletal:     Cervical back: Normal range of motion.  Skin:    Comments: Hot and diaphoretic  Neurological:     General: No focal deficit present.     Mental Status: She is alert.     ED Results / Procedures / Treatments   Labs (all labs ordered are listed, but only abnormal results are displayed) Labs Reviewed  CULTURE, BLOOD (ROUTINE X 2) - Abnormal; Notable for the following  components:      Result Value   Culture   (*)    Value: ESCHERICHIA COLI SUSCEPTIBILITIES TO FOLLOW Performed at [facility], [address] [place], [place] [address]    All other components within normal limits  URINE CULTURE - Abnormal; Notable for the following components:   Culture   (*)    Value: <10,000 COLONIES/mL INSIGNIFICANT GROWTH Performed at [facility], [address] [place], [place] [address]    All other components within normal limits  BLOOD CULTURE ID PANEL (REFLEXED) - BCID2 - Abnormal; Notable for the following components:   Enterobacterales DETECTED (*)    Escherichia coli DETECTED (*)    All other components within normal limits  CBC WITH DIFFERENTIAL/PLATELET - Abnormal; Notable for the following components:   Hemoglobin 16.1 (*)    HCT 47.4 (*)     Abs Immature Granulocytes 0.08 (*)    All other components within normal limits  COMPREHENSIVE METABOLIC PANEL - Abnormal; Notable for the following components:   Sodium 134 (*)    Glucose, Bld 112 (*)    Calcium 8.8 (*)    Alkaline Phosphatase 127 (*)    All other components within normal limits  URINALYSIS, W/ REFLEX TO CULTURE (INFECTION SUSPECTED) - Abnormal; Notable for the following components:   Color, Urine STRAW (*)    APPearance HAZY (*)    Specific Gravity, Urine 1.002 (*)    Hgb urine dipstick MODERATE (*)    Leukocytes,Ua LARGE (*)    Bacteria, UA RARE (*)    All other components within normal limits  CULTURE, BLOOD (ROUTINE X 2)  LIPASE, BLOOD  PREGNANCY, URINE  LACTIC ACID, PLASMA    EKG None  Radiology CT ABDOMEN PELVIS W CONTRAST  Result Date: 01/14/2023 CLINICAL DATA:  Left flank pain radiating to back for 2 days. Dysuria. EXAM: CT ABDOMEN AND PELVIS WITH CONTRAST TECHNIQUE: Multidetector CT imaging of the abdomen and pelvis was performed using the standard protocol following bolus administration of intravenous contrast. RADIATION DOSE REDUCTION: This exam was performed according to the departmental dose-optimization program which includes automated exposure control, adjustment of the mA and/or kV according to patient size and/or use of iterative reconstruction technique. CONTRAST:  OMNIPAQUE IOHEXOL 300 MG/ML  SOLN COMPARISON:  07/13/2021. FINDINGS: Lower chest: No acute abnormality. Hepatobiliary: No focal liver abnormality is seen. No gallstones, gallbladder wall thickening, or biliary dilatation. Pancreas: Unremarkable. No pancreatic ductal dilatation or surrounding inflammatory changes. Spleen: Normal in size without focal abnormality. Adrenals/Urinary Tract: The adrenal glands are within normal limits. Slightly delayed nephrogram is noted on the left. No renal calculus bilaterally. No ureteral calculus or obstructive uropathy. There is urothelial enhancement  on the left. Perinephric and periureteral fat stranding are noted on the left. Diffuse bladder wall thickening is noted. Stomach/Bowel: Stomach is within normal limits. Appendix appears normal. No evidence of bowel wall thickening, distention, or inflammatory changes. No free air or pneumatosis. Vascular/Lymphatic: No significant vascular findings are present. Prominent lymph nodes are noted in the periaortic space on the left, likely reactive. Reproductive: Uterus and bilateral adnexa are unremarkable. Other: Small amount of free fluid is noted in the cul-de-sac. There is a fat containing umbilical hernia with mild fat stranding at the omentum and in the hernia sac. Musculoskeletal: Mild degenerative changes are noted in the lower lumbar spine. No acute osseous abnormality. IMPRESSION: 1. Delayed nephrogram on the left with urothelial enhancement and perinephric and periureteral fat stranding on the  left suggesting ureteritis/pyelonephritis. No renal calculus or obstructive uropathy bilaterally. 2. Bladder wall thickening, compatible with cystitis. 3. Fat containing umbilical hernia with fat stranding in the hernia pouch and omentum. Correlate clinically to exclude incarceration. Electronically Signed   By: Thornell Sartorius M.D.   On: 01/14/2023 02:59    Procedures .Critical Care  Performed by: Marily Memos, MD Authorized by: Marily Memos, MD   Critical care provider statement:    Critical care time (minutes):  30   Critical care was necessary to treat or prevent imminent or life-threatening deterioration of the following conditions:  Sepsis   Critical care was time spent personally by me on the following activities:  Development of treatment plan with patient or surrogate, discussions with consultants, evaluation of patient's response to treatment, examination of patient, ordering and review of laboratory studies, ordering and review of radiographic studies, ordering and performing treatments and  interventions, pulse oximetry, re-evaluation of patient's condition and review of old charts     Medications Ordered in ED Medications  lactated ringers bolus 1,000 mL (0 mLs Intravenous Stopped 01/14/23 0110)  fentaNYL (SUBLIMAZE) injection 50 mcg (50 mcg Intravenous Given 01/13/23 2358)  acetaminophen (TYLENOL) tablet 1,000 mg (1,000 mg Oral Given 01/14/23 0031)  cefTRIAXone (ROCEPHIN) 2 g in sodium chloride 0.9 % 100 mL IVPB (0 g Intravenous Stopped 01/14/23 0110)  lactated ringers bolus 1,000 mL (0 mLs Intravenous Stopped 01/14/23 0448)  iohexol (OMNIPAQUE) 300 MG/ML solution 100 mL (100 mLs Intravenous Contrast Given 01/14/23 0234)  oxyCODONE-acetaminophen (PERCOCET/ROXICET) 5-325 MG per tablet 2 tablet (2 tablets Oral Given 01/14/23 0317)  ibuprofen (ADVIL) tablet 800 mg (800 mg Oral Given 01/14/23 0326)  lactated ringers bolus 1,000 mL (0 mLs Intravenous Stopped 01/14/23 0448)    ED Course/ Medical Decision Making/ A&P                             Medical Decision Making Amount and/or Complexity of Data Reviewed Labs: ordered. Radiology: ordered.  Risk OTC drugs. Prescription drug management.  Suspect pyelonephritis versus less likely stone versus muscular pain.  She feels warm so we will get a rectal temperature.  Will treat symptomatically for now.  Temperature of 101.8.  Explains her tachycardia and skin findings.  Overall appears well we will order all of the sepsis orders and hold off on calling code sepsis as her blood pressure is okay and she appears well and I suspect it is early pyelo, so will treat with rocephin and fluids for now.  UA not entirely c/w pyelo. Will ct to ensure no biliary or appendix abnormalities.  CT c/w pyelo as well. Patient with chills and peresistent fever, will give NSAID and more fluids.  Reeval and patient feeling much better. Fever resolved. Tolerating PO. HR <100, BP normal and stable. Shared decision making on admission vs discharge. She can get  back easily and feels comfortable going home with prescriptions. Partner in the room agrees. Will return for any return of severe symptoms or new symptoms.    Final Clinical Impression(s) / ED Diagnoses Final diagnoses:  Pyelonephritis    Rx / DC Orders ED Discharge Orders          Ordered    cephALEXin (KEFLEX) 500 MG capsule  4 times daily        01/14/23 0548    ondansetron (ZOFRAN-ODT) 4 MG disintegrating tablet        01/14/23 0548    oxyCODONE-acetaminophen (PERCOCET)  5-325 MG tablet  Every 8 hours PRN        01/14/23 0548              Bakary Bramer, Barbara Cower, MD 01/15/23 2324    Marily Memos, MD 01/15/23 2325

## 2023-01-14 NOTE — ED Notes (Addendum)
Lab called back critical result- Ecoli positive 1 of 4 bottles on BCID- Dr Roselyn Bering aware- instructed to call pt and tell them to come in to ED for IV anbx- attempted to call pt- message left with mother, Gigi Gin that pt needs to come to ED for anbx therapy- Peggy said she would relay message.

## 2023-01-14 NOTE — ED Notes (Signed)
..ED TO INPATIENT HANDOFF REPORT  ED Nurse Name and Phone #: Rosemary Holms, RN [phone]  S Name/Age/Gender [name] 32 y.o. female Room/Bed: APA01/APA01  Code Status   Code Status: Full Code  Home/SNF/Other Home Patient oriented to: self, place, time, and situation Is this baseline? Yes   Triage Complete: Triage complete  Chief Complaint Bacteremia [R78.81]  Triage Note Pt called and informed she needed to come back to ED for IV anbx as instructed by Dr Roselyn Bering, who was given critical result for positive blood cultures that came back today.    Allergies Allergies  Allergen Reactions   Trileptal [Oxcarbazepine] Nausea And Vomiting    Level of Care/Admitting Diagnosis ED Disposition     ED Disposition  Admit   Condition  --   Comment  Hospital Area: [facility] [[id number]]  Level of Care: Med-Surg [16]  Covid Evaluation: Asymptomatic - no recent exposure (last 10 days) testing not required  Diagnosis: Bacteremia [790.7.ICD-9-CM]  Admitting Physician: Lilyan Gilford [[id number]]  Attending Physician: Lilyan Gilford [[id number]]          B Medical/Surgery History Past Medical History:  Diagnosis Date   Asthma    Bipolar 1 disorder (HCC)    " states she uded to be when she was a child"   GERD (gastroesophageal reflux disease)    Past Surgical History:  Procedure Laterality Date   LUMBAR LAMINECTOMY/DECOMPRESSION MICRODISCECTOMY Left 04/18/2018   Procedure: Microdiscectomy left - Lumbar five-Sacral one;  Surgeon: Julio Sicks, MD;  Location: [facility] OR;  Service: Neurosurgery;  Laterality: Left;   MYRINGOTOMY Bilateral    as a child   TONSILLECTOMY       A IV Location/Drains/Wounds Patient Lines/Drains/Airways Status     Active Line/Drains/Airways     Name Placement date Placement time Site Days   Peripheral IV 01/14/23 20 G Left Antecubital 01/14/23  2232  Antecubital  less than 1            Intake/Output Last 24 hours No  intake or output data in the 24 hours ending 01/14/23 2344  Labs/Imaging Results for orders placed or performed during the hospital encounter of 01/14/23 (from the past 48 hour(s))  CBC     Status: Abnormal   Collection Time: 01/14/23 10:32 PM  Result Value Ref Range   WBC 11.3 (H) 4.0 - 10.5 K/uL   RBC 4.57 3.87 - 5.11 MIL/uL   Hemoglobin 14.9 12.0 - 15.0 g/dL   HCT 36.6 44.0 - 34.7 %   MCV 95.0 80.0 - 100.0 fL   MCH 32.6 26.0 - 34.0 pg   MCHC 34.3 30.0 - 36.0 g/dL   RDW 42.5 95.6 - 38.7 %   Platelets 210 150 - 400 K/uL   nRBC 0.0 0.0 - 0.2 %    Comment: Performed at [facility], [address] [place], [place] [address]  Basic metabolic panel     Status: Abnormal   Collection Time: 01/14/23 10:32 PM  Result Value Ref Range   Sodium 137 135 - 145 mmol/L   Potassium 3.3 (L) 3.5 - 5.1 mmol/L   Chloride 101 98 - 111 mmol/L   CO2 26 22 - 32 mmol/L   Glucose, Bld 96 70 - 99 mg/dL    Comment: Glucose reference range applies only to samples taken after fasting for at least 8 hours.   BUN 8 6 - 20 mg/dL   Creatinine, Ser 2.95 0.44 - 1.00 mg/dL   Calcium 8.7 (L)  8.9 - 10.3 mg/dL   GFR, Estimated >02 >72 mL/min    Comment: (NOTE) Calculated using the CKD-EPI Creatinine Equation (2021)    Anion gap 10 5 - 15    Comment: Performed at [facility], [address] [place], [place] [address]  Blood culture (routine x 2)     Status: None (Preliminary result)   Collection Time: 01/14/23 10:32 PM   Specimen: BLOOD  Result Value Ref Range   Specimen Description BLOOD BLOOD LEFT ARM    Special Requests      BOTTLES DRAWN AEROBIC AND ANAEROBIC Blood Culture results may not be optimal due to an excessive volume of blood received in culture bottles Performed at [facility], [address] [place], [place] [address]    Culture PENDING    Report Status PENDING   Blood culture (routine x 2)     Status: None (Preliminary result)   Collection Time: 01/14/23 10:48 PM   Specimen: BLOOD  Result  Value Ref Range   Specimen Description BLOOD RIGHT ANTECUBITAL    Special Requests      BOTTLES DRAWN AEROBIC AND ANAEROBIC Blood Culture adequate volume Performed at [facility], [address], [place], [place] [address]    Culture PENDING    Report Status PENDING   Lactic acid, plasma     Status: None   Collection Time: 01/14/23 10:48 PM  Result Value Ref Range   Lactic Acid, Venous 0.9 0.5 - 1.9 mmol/L    Comment: Performed at [facility], [address] [place], [place] [address]   CT ABDOMEN PELVIS W CONTRAST  Result Date: 01/14/2023 CLINICAL DATA:  Left flank pain radiating to back for 2 days. Dysuria. EXAM: CT ABDOMEN AND PELVIS WITH CONTRAST TECHNIQUE: Multidetector CT imaging of the abdomen and pelvis was performed using the standard protocol following bolus administration of intravenous contrast. RADIATION DOSE REDUCTION: This exam was performed according to the departmental dose-optimization program which includes automated exposure control, adjustment of the mA and/or kV according to patient size and/or use of iterative reconstruction technique. CONTRAST:  OMNIPAQUE IOHEXOL 300 MG/ML  SOLN COMPARISON:  07/13/2021. FINDINGS: Lower chest: No acute abnormality. Hepatobiliary: No focal liver abnormality is seen. No gallstones, gallbladder wall thickening, or biliary dilatation. Pancreas: Unremarkable. No pancreatic ductal dilatation or surrounding inflammatory changes. Spleen: Normal in size without focal abnormality. Adrenals/Urinary Tract: The adrenal glands are within normal limits. Slightly delayed nephrogram is noted on the left. No renal calculus bilaterally. No ureteral calculus or obstructive uropathy. There is urothelial enhancement on the left. Perinephric and periureteral fat stranding are noted on the left. Diffuse bladder wall thickening is noted. Stomach/Bowel: Stomach is within normal limits. Appendix appears normal. No evidence of bowel wall thickening, distention, or  inflammatory changes. No free air or pneumatosis. Vascular/Lymphatic: No significant vascular findings are present. Prominent lymph nodes are noted in the periaortic space on the left, likely reactive. Reproductive: Uterus and bilateral adnexa are unremarkable. Other: Small amount of free fluid is noted in the cul-de-sac. There is a fat containing umbilical hernia with mild fat stranding at the omentum and in the hernia sac. Musculoskeletal: Mild degenerative changes are noted in the lower lumbar spine. No acute osseous abnormality. IMPRESSION: 1. Delayed nephrogram on the left with urothelial enhancement and perinephric and periureteral fat stranding on the left suggesting ureteritis/pyelonephritis. No renal calculus or obstructive uropathy bilaterally. 2. Bladder wall thickening, compatible with cystitis. 3. Fat containing umbilical hernia with fat stranding in the hernia pouch and  omentum. Correlate clinically to exclude incarceration. Electronically Signed   By: Thornell Sartorius M.D.   On: 01/14/2023 02:59    Pending Labs [employer] (From admission, onward)     Start     Ordered   Signed and Held  HIV Antibody (routine testing w rflx)  (HIV Antibody (Routine testing w reflex) panel)  Once,   R        Signed and Held   Signed and Held  Comprehensive metabolic panel  Tomorrow morning,   R        Signed and Held   Signed and Held  Magnesium  Tomorrow morning,   R        Signed and Held   Signed and Held  CBC with Differential/Platelet  Tomorrow morning,   R        Signed and Held            Vitals/Pain Today's Vitals   01/14/23 2114 01/14/23 2134  BP: (!) 147/77   Pulse: 100   Temp: 98.3 F (36.8 C)   TempSrc: Oral   SpO2: 98%   Weight:  229 lb 15 oz (104.3 kg)  Height:  5\' 7"  (1.702 m)  PainSc:  3     Isolation Precautions No active isolations  Medications Medications  sodium chloride 0.9 % bolus 1,000 mL (1,000 mLs Intravenous New Bag/Given 01/14/23 2300)    Followed by   0.9 %  sodium chloride infusion (has no administration in time range)  cefTRIAXone (ROCEPHIN) 1 g in sodium chloride 0.9 % 100 mL IVPB (1 g Intravenous New Bag/Given 01/14/23 2302)    Mobility walks     Focused Assessments Cardiac Assessment Handoff:    No results found for: "CKTOTAL", "CKMB", "CKMBINDEX", "TROPONINI" No results found for: "DDIMER" Does the Patient currently have chest pain? No    R Recommendations: See Admitting Provider Note  Report given to:   Additional Notes: called back in from visit yesterday due to positive blood cultures. Was experiencing L sided flank pain on yesterday with uncontrolled fever.

## 2023-01-14 NOTE — ED Provider Notes (Signed)
[place] EMERGENCY DEPARTMENT AT [facility] Provider Note   CSN: [id number] Arrival date & time: 01/14/23  2100     History  Chief Complaint  Patient presents with   positive blood cultures    [name] is a 32 y.o. female.  HPI   Patient has a history of asthma.  She was seen in the emergency room yesterday and was diagnosed with pyelonephritis.  Patient had laboratory tests and a CT scan.  CT scan was consistent with pyelonephritis.  No evidence of kidney stones.  Patient had blood cultures performed early in the morning at about 12:20 AM.  This afternoon they came back positive for E. coli.  Patient was called and instructed to come to the ED.  Patient states she still having some intermittent chills.  She did have an episode of chest pain.  She has been able to tolerate her medications.  Home Medications Prior to Admission medications   Medication Sig Start Date End Date Taking? Authorizing Provider  albuterol (PROVENTIL HFA;VENTOLIN HFA) 108 (90 Base) MCG/ACT inhaler Inhale 2 puffs into the lungs every 6 (six) hours as needed for wheezing or shortness of breath.    [provider]  amoxicillin (AMOXIL) 500 MG capsule Take 1 capsule (500 mg total) by mouth 3 (three) times daily. 12/21/22   Zadie Rhine, MD  cephALEXin (KEFLEX) 500 MG capsule Take 1 capsule (500 mg total) by mouth 4 (four) times daily for 14 days. 01/14/23 01/28/23  Mesner, Barbara Cower, MD  ondansetron (ZOFRAN-ODT) 4 MG disintegrating tablet 4mg  ODT q4 hours prn nausea/vomit 01/14/23   Mesner, Barbara Cower, MD  oxyCODONE-acetaminophen (PERCOCET) 5-325 MG tablet Take 1-2 tablets by mouth every 8 (eight) hours as needed. 01/14/23   Mesner, Barbara Cower, MD      Allergies    Trileptal [oxcarbazepine]    Review of Systems   Review of Systems  Physical Exam Updated Vital Signs BP (!) 147/77   Pulse 100   Temp 98.3 F (36.8 C) (Oral)   Ht 1.702 m (5\' 7" )   Wt 104.3 kg   LMP  (LMP Unknown)   SpO2 98%    BMI 36.01 kg/m  Physical Exam Vitals and nursing note reviewed.  Constitutional:      General: She is not in acute distress.    Appearance: She is well-developed.  HENT:     Head: Normocephalic and atraumatic.     Right Ear: External ear normal.     Left Ear: External ear normal.  Eyes:     General: No scleral icterus.       Right eye: No discharge.        Left eye: No discharge.     Conjunctiva/sclera: Conjunctivae normal.  Neck:     Trachea: No tracheal deviation.  Cardiovascular:     Rate and Rhythm: Normal rate and regular rhythm.  Pulmonary:     Effort: Pulmonary effort is normal. No respiratory distress.     Breath sounds: Normal breath sounds. No stridor. No wheezing or rales.  Abdominal:     General: Bowel sounds are normal. There is no distension.     Palpations: Abdomen is soft.     Tenderness: There is no abdominal tenderness. There is no guarding or rebound.     Comments: Umbilicus without erythema, induration  Musculoskeletal:        General: No tenderness or deformity.     Cervical back: Neck supple.  Skin:    General: Skin is warm  and dry.     Findings: No rash.  Neurological:     General: No focal deficit present.     Mental Status: She is alert.     Cranial Nerves: No cranial nerve deficit, dysarthria or facial asymmetry.     Sensory: No sensory deficit.     Motor: No abnormal muscle tone or seizure activity.     Coordination: Coordination normal.  Psychiatric:        Mood and Affect: Mood normal.     ED Results / Procedures / Treatments   Labs (all labs ordered are listed, but only abnormal results are displayed) Labs Reviewed  CBC - Abnormal; Notable for the following components:      Result Value   WBC 11.3 (*)    All other components within normal limits  BASIC METABOLIC PANEL - Abnormal; Notable for the following components:   Potassium 3.3 (*)    Calcium 8.7 (*)    All other components within normal limits  CULTURE, BLOOD (ROUTINE X  2)  CULTURE, BLOOD (ROUTINE X 2)  LACTIC ACID, PLASMA    EKG None  Radiology CT ABDOMEN PELVIS W CONTRAST  Result Date: 01/14/2023 CLINICAL DATA:  Left flank pain radiating to back for 2 days. Dysuria. EXAM: CT ABDOMEN AND PELVIS WITH CONTRAST TECHNIQUE: Multidetector CT imaging of the abdomen and pelvis was performed using the standard protocol following bolus administration of intravenous contrast. RADIATION DOSE REDUCTION: This exam was performed according to the departmental dose-optimization program which includes automated exposure control, adjustment of the mA and/or kV according to patient size and/or use of iterative reconstruction technique. CONTRAST:  OMNIPAQUE IOHEXOL 300 MG/ML  SOLN COMPARISON:  07/13/2021. FINDINGS: Lower chest: No acute abnormality. Hepatobiliary: No focal liver abnormality is seen. No gallstones, gallbladder wall thickening, or biliary dilatation. Pancreas: Unremarkable. No pancreatic ductal dilatation or surrounding inflammatory changes. Spleen: Normal in size without focal abnormality. Adrenals/Urinary Tract: The adrenal glands are within normal limits. Slightly delayed nephrogram is noted on the left. No renal calculus bilaterally. No ureteral calculus or obstructive uropathy. There is urothelial enhancement on the left. Perinephric and periureteral fat stranding are noted on the left. Diffuse bladder wall thickening is noted. Stomach/Bowel: Stomach is within normal limits. Appendix appears normal. No evidence of bowel wall thickening, distention, or inflammatory changes. No free air or pneumatosis. Vascular/Lymphatic: No significant vascular findings are present. Prominent lymph nodes are noted in the periaortic space on the left, likely reactive. Reproductive: Uterus and bilateral adnexa are unremarkable. Other: Small amount of free fluid is noted in the cul-de-sac. There is a fat containing umbilical hernia with mild fat stranding at the omentum and in the  hernia sac. Musculoskeletal: Mild degenerative changes are noted in the lower lumbar spine. No acute osseous abnormality. IMPRESSION: 1. Delayed nephrogram on the left with urothelial enhancement and perinephric and periureteral fat stranding on the left suggesting ureteritis/pyelonephritis. No renal calculus or obstructive uropathy bilaterally. 2. Bladder wall thickening, compatible with cystitis. 3. Fat containing umbilical hernia with fat stranding in the hernia pouch and omentum. Correlate clinically to exclude incarceration. Electronically Signed   By: Thornell Sartorius M.D.   On: 01/14/2023 02:59    Procedures Procedures    Medications Ordered in ED Medications  sodium chloride 0.9 % bolus 1,000 mL (1,000 mLs Intravenous New Bag/Given 01/14/23 2300)    Followed by  0.9 %  sodium chloride infusion (has no administration in time range)  cefTRIAXone (ROCEPHIN) 1 g in sodium chloride 0.9 %  100 mL IVPB (1 g Intravenous New Bag/Given 01/14/23 2302)    ED Course/ Medical Decision Making/ A&P Clinical Course as of 01/14/23 2343  Thu Jan 14, 2023  2342 Case discussed with Dr Carren Rang [JK]    Clinical Course User Index [JK] Linwood Dibbles, MD                             Medical Decision Making Problems Addressed: Positive blood culture: acute illness or injury that poses a threat to life or bodily functions Pyelonephritis: acute illness or injury that poses a threat to life or bodily functions  Amount and/or Complexity of Data Reviewed Labs: ordered.    Details: Lactic acid level normal.  No leukocytosis  Risk Prescription drug management. Decision regarding hospitalization.    Patient presented to the ED for evaluation of positive blood cultures.  Patient recently seen in the emergency room earlier this morning.  She was diagnosed with pyelonephritis.  Patient did reports she was having persistent tachycardia and fever while she was in the emergency room.  Patient was treated with IV  antibiotics.  Her blood cultures today were positive for E. coli.  At this time she does not show signs of evolving sepsis.  There is no lactic acidosis.  However with her bacteremia will have her be admitted to the hospital for observation.        Final Clinical Impression(s) / ED Diagnoses Final diagnoses:  Pyelonephritis  Positive blood culture    Rx / DC Orders ED Discharge Orders     None         Linwood Dibbles, MD 01/14/23 2343

## 2023-01-14 NOTE — ED Triage Notes (Signed)
Pt called and informed she needed to come back to ED for IV anbx as instructed by Dr Roselyn Bering, who was given critical result for positive blood cultures that came back today.

## 2023-01-14 NOTE — ED Notes (Signed)
Note entered on 01/14/23 at 1341:  Lab called a critical result of 1 anaerobic blood culture bottle positive for gram negative rods at 1333. Dr. Estell Harpin made aware at 1335 and gave order to notify pt to return to ED for IV antibiotics. Attempted to call pt and her emergency contacts x 4 with no answer and one of the numbers being an incorrect number.

## 2023-01-14 NOTE — ED Provider Notes (Incomplete)
  [place] EMERGENCY DEPARTMENT AT [facility] Provider Note   CSN: [id number] Arrival date & time: 01/13/23  2311     History {Add pertinent medical, surgical, social history, OB history to HPI:1} Chief Complaint  Patient presents with  . Flank Pain    [name] is a 32 y.o. female.  HPI     Home Medications Prior to Admission medications   Medication Sig Start Date End Date Taking? Authorizing Provider  albuterol (PROVENTIL HFA;VENTOLIN HFA) 108 (90 Base) MCG/ACT inhaler Inhale 2 puffs into the lungs every 6 (six) hours as needed for wheezing or shortness of breath.    [provider]  amoxicillin (AMOXIL) 500 MG capsule Take 1 capsule (500 mg total) by mouth 3 (three) times daily. 12/21/22   Zadie Rhine, MD      Allergies    Trileptal [oxcarbazepine]    Review of Systems   Review of Systems  Physical Exam Updated Vital Signs BP 122/84   Pulse (!) 108   Temp (!) 101.8 F (38.8 C) (Rectal)   Resp 18   LMP  (LMP Unknown)   SpO2 96%  Physical Exam  ED Results / Procedures / Treatments   Labs (all labs ordered are listed, but only abnormal results are displayed) Labs Reviewed  CULTURE, BLOOD (ROUTINE X 2)  CULTURE, BLOOD (ROUTINE X 2)  CBC WITH DIFFERENTIAL/PLATELET  COMPREHENSIVE METABOLIC PANEL  LIPASE, BLOOD  URINALYSIS, W/ REFLEX TO CULTURE (INFECTION SUSPECTED)  PREGNANCY, URINE  LACTIC ACID, PLASMA  LACTIC ACID, PLASMA    EKG None  Radiology No results found.  Procedures Procedures  {Document cardiac monitor, telemetry assessment procedure when appropriate:1}  Medications Ordered in ED Medications  acetaminophen (TYLENOL) tablet 1,000 mg (has no administration in time range)  cefTRIAXone (ROCEPHIN) 2 g in sodium chloride 0.9 % 100 mL IVPB (has no administration in time range)  lactated ringers bolus 1,000 mL (1,000 mLs Intravenous New Bag/Given 01/14/23 0000)  fentaNYL (SUBLIMAZE) injection 50 mcg (50 mcg  Intravenous Given 01/13/23 2358)    ED Course/ Medical Decision Making/ A&P   {   Click here for ABCD2, HEART and other calculatorsREFRESH Note before signing :1}                          Medical Decision Making Amount and/or Complexity of Data Reviewed Labs: ordered.  Risk OTC drugs. Prescription drug management.   ***  {Document critical care time when appropriate:1} {Document review of labs and clinical decision tools ie heart score, Chads2Vasc2 etc:1}  {Document your independent review of radiology images, and any outside records:1} {Document your discussion with family members, caretakers, and with consultants:1} {Document social determinants of health affecting pt's care:1} {Document your decision making why or why not admission, treatments were needed:1} Final Clinical Impression(s) / ED Diagnoses Final diagnoses:  None    Rx / DC Orders ED Discharge Orders     None

## 2023-01-15 ENCOUNTER — Telehealth: Payer: Self-pay

## 2023-01-15 DIAGNOSIS — N10 Acute tubulo-interstitial nephritis: Principal | ICD-10-CM

## 2023-01-15 DIAGNOSIS — E876 Hypokalemia: Secondary | ICD-10-CM

## 2023-01-15 DIAGNOSIS — N12 Tubulo-interstitial nephritis, not specified as acute or chronic: Principal | ICD-10-CM | POA: Diagnosis present

## 2023-01-15 DIAGNOSIS — K439 Ventral hernia without obstruction or gangrene: Secondary | ICD-10-CM

## 2023-01-15 DIAGNOSIS — R7881 Bacteremia: Secondary | ICD-10-CM

## 2023-01-15 DIAGNOSIS — F17200 Nicotine dependence, unspecified, uncomplicated: Secondary | ICD-10-CM

## 2023-01-15 LAB — CBC WITH DIFFERENTIAL/PLATELET
Abs Immature Granulocytes: 0.06 10*3/uL (ref 0.00–0.07)
Basophils Absolute: 0 10*3/uL (ref 0.0–0.1)
Basophils Relative: 0 %
Eosinophils Absolute: 0.2 10*3/uL (ref 0.0–0.5)
Eosinophils Relative: 3 %
HCT: 38 % (ref 36.0–46.0)
Hemoglobin: 13 g/dL (ref 12.0–15.0)
Immature Granulocytes: 1 %
Lymphocytes Relative: 26 %
Lymphs Abs: 2.2 10*3/uL (ref 0.7–4.0)
MCH: 32.7 pg (ref 26.0–34.0)
MCHC: 34.2 g/dL (ref 30.0–36.0)
MCV: 95.5 fL (ref 80.0–100.0)
Monocytes Absolute: 0.9 10*3/uL (ref 0.1–1.0)
Monocytes Relative: 11 %
Neutro Abs: 5.1 10*3/uL (ref 1.7–7.7)
Neutrophils Relative %: 59 %
Platelets: 185 10*3/uL (ref 150–400)
RBC: 3.98 MIL/uL (ref 3.87–5.11)
RDW: 14.4 % (ref 11.5–15.5)
WBC: 8.5 10*3/uL (ref 4.0–10.5)
nRBC: 0 % (ref 0.0–0.2)

## 2023-01-15 LAB — COMPREHENSIVE METABOLIC PANEL
ALT: 14 U/L (ref 0–44)
AST: 13 U/L — ABNORMAL LOW (ref 15–41)
Albumin: 2.7 g/dL — ABNORMAL LOW (ref 3.5–5.0)
Alkaline Phosphatase: 84 U/L (ref 38–126)
Anion gap: 7 (ref 5–15)
BUN: 7 mg/dL (ref 6–20)
CO2: 25 mmol/L (ref 22–32)
Calcium: 8.2 mg/dL — ABNORMAL LOW (ref 8.9–10.3)
Chloride: 106 mmol/L (ref 98–111)
Creatinine, Ser: 0.58 mg/dL (ref 0.44–1.00)
GFR, Estimated: >60 mL/min (ref >60)
Glucose, Bld: 120 mg/dL — ABNORMAL HIGH (ref 70–99)
Potassium: 3.2 mmol/L — ABNORMAL LOW (ref 3.5–5.1)
Sodium: 138 mmol/L (ref 135–145)
Total Bilirubin: 0.2 mg/dL — ABNORMAL LOW (ref 0.3–1.2)
Total Protein: 5.6 g/dL — ABNORMAL LOW (ref 6.5–8.1)

## 2023-01-15 LAB — CULTURE, BLOOD (ROUTINE X 2): Culture: NO GROWTH

## 2023-01-15 LAB — URINE CULTURE: Culture: <10000 — AB

## 2023-01-15 LAB — HIV ANTIBODY (ROUTINE TESTING W REFLEX): HIV Screen 4th Generation wRfx: NONREACTIVE

## 2023-01-15 LAB — MAGNESIUM: Magnesium: 1.9 mg/dL (ref 1.7–2.4)

## 2023-01-15 MED ORDER — ACETAMINOPHEN 325 MG PO TABS: 650.0000 mg | ORAL_TABLET | Freq: Four times a day (QID) | ORAL | Status: DC | PRN

## 2023-01-15 MED ORDER — HEPARIN SODIUM (PORCINE) 5000 UNIT/ML IJ SOLN
INTRAMUSCULAR | Status: AC
  Filled 2023-01-15: qty 1

## 2023-01-15 MED ORDER — ONDANSETRON HCL 4 MG PO TABS: 4.0000 mg | ORAL_TABLET | Freq: Four times a day (QID) | ORAL | Status: DC | PRN

## 2023-01-15 MED ORDER — ACETAMINOPHEN 650 MG RE SUPP: 650.0000 mg | Freq: Four times a day (QID) | RECTAL | Status: DC | PRN

## 2023-01-15 MED ORDER — OXYCODONE HCL 5 MG PO TABS: 5.0000 mg | ORAL_TABLET | ORAL | Status: DC | PRN

## 2023-01-15 MED ORDER — HEPARIN SODIUM (PORCINE) 5000 UNIT/ML IJ SOLN
5000.0000 [IU] | Freq: Three times a day (TID) | INTRAMUSCULAR | Status: DC
  Administered 2023-01-15 – 2023-01-16 (×4): 5000 [IU] via SUBCUTANEOUS
  Filled 2023-01-15 (×2): qty 1

## 2023-01-15 MED ORDER — POTASSIUM CHLORIDE CRYS ER 20 MEQ PO TBCR
40.0000 meq | EXTENDED_RELEASE_TABLET | Freq: Once | ORAL | Status: AC
  Administered 2023-01-15: 40 meq via ORAL
  Filled 2023-01-15: qty 2

## 2023-01-15 MED ORDER — SODIUM CHLORIDE 0.9 % IV SOLN
2.0000 g | INTRAVENOUS | Status: DC
  Administered 2023-01-15: 2 g via INTRAVENOUS
  Filled 2023-01-15: qty 20

## 2023-01-15 MED ORDER — POTASSIUM CHLORIDE 20 MEQ PO PACK
40.0000 meq | PACK | Freq: Once | ORAL | Status: AC
  Administered 2023-01-15: 40 meq via ORAL

## 2023-01-15 MED ORDER — POTASSIUM CHLORIDE 20 MEQ PO PACK
PACK | ORAL | Status: AC
  Filled 2023-01-15: qty 2

## 2023-01-15 MED ORDER — ONDANSETRON HCL 4 MG/2ML IJ SOLN: 4.0000 mg | Freq: Four times a day (QID) | INTRAMUSCULAR | Status: DC | PRN

## 2023-01-15 NOTE — Assessment & Plan Note (Signed)
-   Potassium 3.3 - 40 mEq of potassium ordered - Trend with a.m. labs

## 2023-01-15 NOTE — H&P (Signed)
History and Physical    Patient: [name]:[id number] DOB: 1991-05-23 DOA: 01/14/2023 DOS: the patient was seen and examined on 01/15/2023 PCP: Patient, No Pcp Per  Patient coming from: Home  Chief Complaint:  Chief Complaint  Patient presents with   positive blood cultures   HPI: [name] is a 32 y.o. female with medical history significant of bipolar 1 disorder, GERD, smoking, and more presents the ED with a chief complaint of flank pain.  Patient came in for right-sided flank pain.  She was febrile at home but did not have a thermometer to measure her temperature.  In the ED on her first visit her temperature was as high as 102.1.  Patient describes right flank pain as sharp and intermittent.  She is not sure what makes it better.  Deep breaths and positional changes make it worse.  She has had dysuria but no hematuria.  Patient has never had pyelonephritis.  At her first ER visit today she was treated with Rocephin and fluids.  Blood culture were taken and patient was called to return to the ER when they came back positive for E. coli.  Patient reports she does smoke.  She reports she is not interested in quitting smoking because it keeps her seen and she does not need any medication to keep [name] only smoking.  Nicotine patches ordered.  She does not drink alcohol.  She is not vaccinated for COVID or flu.  Patient is full code. Review of Systems: As mentioned in the history of present illness. All other systems reviewed and are negative. Past Medical History:  Diagnosis Date   Asthma    Bipolar 1 disorder (HCC)    " states she uded to be when she was a child"   GERD (gastroesophageal reflux disease)    Past Surgical History:  Procedure Laterality Date   LUMBAR LAMINECTOMY/DECOMPRESSION MICRODISCECTOMY Left 04/18/2018   Procedure: Microdiscectomy left - Lumbar five-Sacral one;  Surgeon: Julio Sicks, MD;  Location: [facility] OR;  Service: Neurosurgery;  Laterality: Left;    MYRINGOTOMY Bilateral    as a child   TONSILLECTOMY     Social History:  reports that she has been smoking cigarettes. She has a 14 pack-year smoking history. She has never used smokeless tobacco. She reports that she does not drink alcohol and does not use drugs.  Allergies  Allergen Reactions   Trileptal [Oxcarbazepine] Nausea And Vomiting    History reviewed. No pertinent family history.  Prior to Admission medications   Medication Sig Start Date End Date Taking? Authorizing Provider  albuterol (PROVENTIL HFA;VENTOLIN HFA) 108 (90 Base) MCG/ACT inhaler Inhale 2 puffs into the lungs every 6 (six) hours as needed for wheezing or shortness of breath.    [provider]  amoxicillin (AMOXIL) 500 MG capsule Take 1 capsule (500 mg total) by mouth 3 (three) times daily. 12/21/22   Zadie Rhine, MD  cephALEXin (KEFLEX) 500 MG capsule Take 1 capsule (500 mg total) by mouth 4 (four) times daily for 14 days. 01/14/23 01/28/23  Mesner, Barbara Cower, MD  ondansetron (ZOFRAN-ODT) 4 MG disintegrating tablet 4mg  ODT q4 hours prn nausea/vomit 01/14/23   Mesner, Barbara Cower, MD  oxyCODONE-acetaminophen (PERCOCET) 5-325 MG tablet Take 1-2 tablets by mouth every 8 (eight) hours as needed. 01/14/23   Marily Memos, MD    Physical Exam: Vitals:   01/14/23 2357 01/15/23 0039 01/15/23 0040 01/15/23 0435  BP: 118/79  (!) 126/92 113/72  Pulse: 83  91 86  Resp:  14  18 18   Temp:   98 F (36.7 C) 98.2 F (36.8 C)  TempSrc:   Oral Oral  SpO2: 98%  96% 96%  Weight:  108.6 kg    Height:  5\' 7"  (1.702 m)     1.  General: Patient lying supine in bed,  no acute distress   2. Psychiatric: Alert and oriented x 3, mood and behavior normal for situation, pleasant and cooperative with exam   3. Neurologic: Speech and language are normal, face is symmetric, moves all 4 extremities voluntarily, at baseline without acute deficits on limited exam   4. HEENMT:  Head is atraumatic, normocephalic, pupils reactive to  light, neck is supple, trachea is midline, mucous membranes are moist   5. Respiratory : Lungs are clear to auscultation bilaterally without wheezing, rhonchi, rales, no cyanosis, no increase in work of breathing or accessory muscle use   6. Cardiovascular : Heart rate normal, rhythm is regular, no murmurs, rubs or gallops, no peripheral edema, peripheral pulses palpated   7. Gastrointestinal:  Abdomen is soft, nondistended, minimally tender to palpation around umbilical hernia bowel sounds active, no masses or organomegaly palpated   8. Skin:  Skin is warm, dry and intact without rashes, acute lesions, or ulcers on limited exam   9.Musculoskeletal:  No acute deformities or trauma, no asymmetry in tone, no peripheral edema, peripheral pulses palpated, no tenderness to palpation in the extremities  Data Reviewed: Labs and imaging from both ER visits reviewed Patient last admitted in October 2019 for lumbar radiculopathy by neurosurgery.  She was ready for discharge home and she was standing and walking without difficulty  Assessment and Plan: * Bacteremia - Blood cultures grew E coli -Rocephin started in the ED - Continue Rocephin - Repeat blood cultures ordered - Source is pyelonephritis/UTI - Patient was febrile with tachycardia at first ER visit today, but currently afebrile, normotensive, vital signs stable, white blood cell count 11.3 - 1 L bolus given in ED - Continue IV fluids - Continue to monitor  Tobacco use disorder - Counseled on importance of cessation - Nicotine patch ordered  Ventral hernia - As seen on CT - Tender on palpation near umbilicus - Known previously, no increased pain, no change in bowel habits - Low suspicion for worsening pathology  Acute pyelonephritis - As seen on CT - Patient did meet SIRS criteria at first ED visit today, but is currently stable - No endorgan damage - E. coli bacteremia - Patient started on Rocephin - Continue  Rocephin - Continue to monitor  Hypokalemia - Potassium 3.3 - 40 mEq of potassium ordered - Trend with a.m. labs      Advance Care Planning:   Code Status: Full Code   Consults: None at this time  Family Communication: No family at bedside  Severity of Illness: The appropriate patient status for this patient is OBSERVATION. Observation status is judged to be reasonable and necessary in order to provide the required intensity of service to ensure the patient's safety. The patient's presenting symptoms, physical exam findings, and initial radiographic and laboratory data in the context of their medical condition is felt to place them at decreased risk for further clinical deterioration. Furthermore, it is anticipated that the patient will be medically stable for discharge from the hospital within 2 midnights of admission.   Author: Lilyan Gilford, DO 01/15/2023 5:51 AM  For on call review [email or web address].

## 2023-01-15 NOTE — TOC Initial Note (Signed)
Transition of Care [facility]) - Initial/Assessment Note    Patient Details  Name: [name] MRN: [id number] Date of Birth: 1991/03/10  [facility] CM/SW Contact:    Annice Needy, LCSW Phone Number: 01/15/2023, 11:07 AM  Clinical Narrative:                 Patient from home. Admitted for bacteremia. Uninsured, no PCP. Discussed benefits of [facility] with patient. Patient is agreeable to referral to [facility]. Referral made to [facility]. [facility] advises that they will call patient on Monday.    Expected Discharge Plan: Home/Self Care Barriers to Discharge: Continued Medical Work up   Patient Goals and CMS Choice Patient states their goals for this hospitalization and ongoing recovery are:: return home          Expected Discharge Plan and Services       Living arrangements for the past 2 months: Single Family Home                                      Prior Living Arrangements/Services Living arrangements for the past 2 months: Single Family Home Lives with:: Parents Patient language and need for interpreter reviewed:: Yes        Need for Family Participation in Patient Care: Yes (Comment) Care giver support system in place?: Yes (comment)   Criminal Activity/Legal Involvement Pertinent to Current Situation/Hospitalization: No - Comment as needed  Activities of Daily Living Home Assistive Devices/Equipment: None ADL Screening (condition at time of admission) Patient's cognitive ability adequate to safely complete daily activities?: Yes Is the patient deaf or have difficulty hearing?: No Does the patient have difficulty seeing, even when wearing glasses/contacts?: No Does the patient have difficulty concentrating, remembering, or making decisions?: No Patient able to express need for assistance with ADLs?: Yes Does the patient have difficulty dressing or bathing?: No Independently performs ADLs?: Yes (appropriate for  developmental age) Does the patient have difficulty walking or climbing stairs?: No Weakness of Legs: None Weakness of Arms/Hands: None  Permission Sought/Granted                  Emotional Assessment     Affect (typically observed): Appropriate Orientation: : Oriented to Self, Oriented to Place, Oriented to  Time, Oriented to Situation Alcohol / Substance Use: Not Applicable Psych Involvement: No (comment)  Admission diagnosis:  Bacteremia [R78.81] Pyelonephritis [N12] Positive blood culture [R78.81] Patient Active Problem List   Diagnosis Date Noted   Hypokalemia 01/15/2023   Acute pyelonephritis 01/15/2023   Ventral hernia 01/15/2023   Tobacco use disorder 01/15/2023   Bacteremia 01/14/2023   Lumbar radiculopathy 04/18/2018   PCP:  Patient, No Pcp Per Pharmacy:   [facility] [address] [place] - [address] [place] [address] Phone: [phone] Fax: [phone]     Social Determinants of Health (SDOH) Social History: SDOH Screenings   Food Insecurity: No Food Insecurity (01/15/2023)  Housing: Low Risk  (01/15/2023)  Transportation Needs: No Transportation Needs (01/15/2023)  Utilities: Not At Risk (01/15/2023)  Tobacco Use: High Risk (01/14/2023)  Health Literacy: Low Risk  (05/16/2020)   Received from [facility]   SDOH Interventions:     Readmission Risk Interventions     No data to display

## 2023-01-15 NOTE — Assessment & Plan Note (Signed)
-   Blood cultures grew E coli -Rocephin started in the ED - Continue Rocephin - Repeat blood cultures ordered - Source is pyelonephritis/UTI - Patient was febrile with tachycardia at first ER visit today, but currently afebrile, normotensive, vital signs stable, white blood cell count 11.3 - 1 L bolus given in ED - Continue IV fluids - Continue to monitor

## 2023-01-15 NOTE — Telephone Encounter (Signed)
Received referral from Heather at [facility] today to reach out to patient about the [facility] program once the patient is discharged.

## 2023-01-15 NOTE — Plan of Care (Signed)
  Problem: Education: Goal: Knowledge of General Education information will improve Description Including pain rating scale, medication(s)/side effects and non-pharmacologic comfort measures Outcome: Progressing   

## 2023-01-15 NOTE — Progress Notes (Signed)
ASSUMPTION OF CARE NOTE   01/15/2023 2:17 PM  [name] was seen and examined.  The H&P by the admitting provider, orders, imaging was reviewed.  Please see new orders.  Will continue to follow.   Vitals:   01/15/23 0740 01/15/23 1157  BP: 110/60 120/73  Pulse: 88 88  Resp: 20 20  Temp:  98.4 F (36.9 C)  SpO2: 95% 97%    Results for orders placed or performed during the hospital encounter of 01/14/23  Blood culture (routine x 2)   Specimen: BLOOD  Result Value Ref Range   Specimen Description BLOOD BLOOD LEFT ARM    Special Requests      BOTTLES DRAWN AEROBIC AND ANAEROBIC Blood Culture results may not be optimal due to an excessive volume of blood received in culture bottles   Culture      NO GROWTH < 12 HOURS Performed at [facility], [address] [place], [place] [address]    Report Status PENDING   Blood culture (routine x 2)   Specimen: BLOOD  Result Value Ref Range   Specimen Description BLOOD RIGHT ANTECUBITAL    Special Requests      BOTTLES DRAWN AEROBIC AND ANAEROBIC Blood Culture adequate volume   Culture      NO GROWTH < 12 HOURS Performed at [facility], [address] [place], [place] [address]    Report Status PENDING   CBC  Result Value Ref Range   WBC 11.3 (H) 4.0 - 10.5 K/uL   RBC 4.57 3.87 - 5.11 MIL/uL   Hemoglobin 14.9 12.0 - 15.0 g/dL   HCT 96.2 95.2 - 84.1 %   MCV 95.0 80.0 - 100.0 fL   MCH 32.6 26.0 - 34.0 pg   MCHC 34.3 30.0 - 36.0 g/dL   RDW 32.4 40.1 - 02.7 %   Platelets 210 150 - 400 K/uL   nRBC 0.0 0.0 - 0.2 %  Basic metabolic panel  Result Value Ref Range   Sodium 137 135 - 145 mmol/L   Potassium 3.3 (L) 3.5 - 5.1 mmol/L   Chloride 101 98 - 111 mmol/L   CO2 26 22 - 32 mmol/L   Glucose, Bld 96 70 - 99 mg/dL   BUN 8 6 - 20 mg/dL   Creatinine, Ser 2.53 0.44 - 1.00 mg/dL   Calcium 8.7 (L) 8.9 - 10.3 mg/dL   GFR, Estimated >66 >44 mL/min   Anion gap 10 5 - 15  Lactic acid, plasma  Result Value Ref Range   Lactic  Acid, Venous 0.9 0.5 - 1.9 mmol/L  Comprehensive metabolic panel  Result Value Ref Range   Sodium 138 135 - 145 mmol/L   Potassium 3.2 (L) 3.5 - 5.1 mmol/L   Chloride 106 98 - 111 mmol/L   CO2 25 22 - 32 mmol/L   Glucose, Bld 120 (H) 70 - 99 mg/dL   BUN 7 6 - 20 mg/dL   Creatinine, Ser 0.34 0.44 - 1.00 mg/dL   Calcium 8.2 (L) 8.9 - 10.3 mg/dL   Total Protein 5.6 (L) 6.5 - 8.1 g/dL   Albumin 2.7 (L) 3.5 - 5.0 g/dL   AST 13 (L) 15 - 41 U/L   ALT 14 0 - 44 U/L   Alkaline Phosphatase 84 38 - 126 U/L   Total Bilirubin 0.2 (L) 0.3 - 1.2 mg/dL   GFR, Estimated >74 >25 mL/min   Anion gap 7 5 - 15  Magnesium  Result Value Ref Range  Magnesium 1.9 1.7 - 2.4 mg/dL  CBC with Differential/Platelet  Result Value Ref Range   WBC 8.5 4.0 - 10.5 K/uL   RBC 3.98 3.87 - 5.11 MIL/uL   Hemoglobin 13.0 12.0 - 15.0 g/dL   HCT 40.9 81.1 - 91.4 %   MCV 95.5 80.0 - 100.0 fL   MCH 32.7 26.0 - 34.0 pg   MCHC 34.2 30.0 - 36.0 g/dL   RDW 78.2 95.6 - 21.3 %   Platelets 185 150 - 400 K/uL   nRBC 0.0 0.0 - 0.2 %   Neutrophils Relative % 59 %   Neutro Abs 5.1 1.7 - 7.7 K/uL   Lymphocytes Relative 26 %   Lymphs Abs 2.2 0.7 - 4.0 K/uL   Monocytes Relative 11 %   Monocytes Absolute 0.9 0.1 - 1.0 K/uL   Eosinophils Relative 3 %   Eosinophils Absolute 0.2 0.0 - 0.5 K/uL   Basophils Relative 0 %   Basophils Absolute 0.0 0.0 - 0.1 K/uL   Immature Granulocytes 1 %   Abs Immature Granulocytes 0.06 0.00 - 0.07 K/uL     C. Laural Benes, MD [facility]   01/14/2023  9:12 PM How to contact the [facility] Attending or Consulting provider 7A - 7P or covering provider during after hours 7P -7A, for this patient?  Check the care team in [facility] and look for a) attending/consulting [facility] provider listed and b) the [facility] team listed Log into [email or web address] and use [place]'s universal password to access. If you do not have the password, please contact the hospital operator. Locate the [facility] provider you are looking for under  [facility] and page to a number that you can be directly reached. If you still have difficulty reaching the provider, please page the [facility] (Director on Call) for the Hospitalists listed on amion for assistance.

## 2023-01-15 NOTE — Assessment & Plan Note (Signed)
Counseled on importance of cessation. Nicotine patch ordered.

## 2023-01-15 NOTE — Assessment & Plan Note (Signed)
-   As seen on CT - Patient did meet SIRS criteria at first ED visit today, but is currently stable - No endorgan damage - E. coli bacteremia - Patient started on Rocephin - Continue Rocephin - Continue to monitor

## 2023-01-15 NOTE — Assessment & Plan Note (Signed)
-   As seen on CT - Tender on palpation near umbilicus - Known previously, no increased pain, no change in bowel habits - Low suspicion for worsening pathology

## 2023-01-16 LAB — BASIC METABOLIC PANEL
Anion gap: 8 (ref 5–15)
BUN: 8 mg/dL (ref 6–20)
CO2: 25 mmol/L (ref 22–32)
Calcium: 8.5 mg/dL — ABNORMAL LOW (ref 8.9–10.3)
Chloride: 103 mmol/L (ref 98–111)
Creatinine, Ser: 0.56 mg/dL (ref 0.44–1.00)
GFR, Estimated: >60 mL/min (ref >60)
Glucose, Bld: 104 mg/dL — ABNORMAL HIGH (ref 70–99)
Potassium: 3.7 mmol/L (ref 3.5–5.1)
Sodium: 136 mmol/L (ref 135–145)

## 2023-01-16 LAB — CULTURE, BLOOD (ROUTINE X 2)
Special Requests: ADEQUATE
Special Requests: ADEQUATE
Special Requests: ADEQUATE

## 2023-01-16 LAB — MAGNESIUM: Magnesium: 1.9 mg/dL (ref 1.7–2.4)

## 2023-01-16 MED ORDER — CEFDINIR 300 MG PO CAPS: 300.0000 mg | ORAL_CAPSULE | Freq: Two times a day (BID) | ORAL | 0 refills | Status: AC

## 2023-01-16 NOTE — Progress Notes (Signed)
Patient is alert and oriented x4. Animated in conversation. Boyfriend at bedside. Denied pain and is focused on discharge. Bp ran soft last night. Denied additional needs.

## 2023-01-16 NOTE — Discharge Summary (Signed)
Physician Discharge Summary  [name] GEX:[id number] DOB: 1990/08/02 DOA: 01/14/2023  Admit date: 01/14/2023 Discharge date: 01/16/2023  Admitted From:   Home  Disposition:   Home  Recommendations for Outpatient Follow-up:  Follow up with PCP in 1 weeks Please complete full course of antibiotics Please continue tobacco cessation counseling  Discharge Condition: STABLE   CODE STATUS: FULL DIET: regular    Brief Hospitalization Summary: Please see all hospital notes, images, labs for full details of the hospitalization. ADMISSION PROVIDER HPI:  32 y.o. female with medical history significant of bipolar 1 disorder, GERD, smoking, and more presents the ED with a chief complaint of flank pain.  Patient came in for right-sided flank pain.  She was febrile at home but did not have a thermometer to measure her temperature.  In the ED on her first visit her temperature was as high as 102.1.  Patient describes right flank pain as sharp and intermittent.  She is not sure what makes it better.  Deep breaths and positional changes make it worse.  She has had dysuria but no hematuria.  Patient has never had pyelonephritis.  At her first ER visit today she was treated with Rocephin and fluids.  Blood culture were taken and patient was called to return to the ER when they came back positive for E. coli.   Patient reports she does smoke.  She reports she is not interested in quitting smoking because it keeps her seen and she does not need any medication to keep [name] only smoking.  Nicotine patches ordered.  She does not drink alcohol.  She is not vaccinated for COVID or flu.  Patient is full code.  Hospital Course  Patient was admitted into the hospital for treatment of acute pyelonephritis and E. coli bacteremia.  She was treated with IV ceftriaxone and supportive measures.  She responded well to this therapy.  Her flank pain has resolved and she is eating and drinking with no nausea or vomiting or  diarrhea symptoms.  We have reviewed her blood culture sensitivity and we are discharging her on some oral cefdinir based on sensitivities to complete 10 more days to eradicate the infection.  Patient is discharging in stable condition.  Outpatient follow-up strongly recommended.  In addition patient advised to stop all tobacco use.  Discharge Diagnoses:  Principal Problem:   Bacteremia Active Problems:   Hypokalemia   Acute pyelonephritis   Ventral hernia   Tobacco use disorder   Pyelonephritis   Discharge Instructions:  Allergies as of 01/16/2023       Reactions   Trileptal [oxcarbazepine] Nausea And Vomiting        Medication List     STOP taking these medications    amoxicillin 500 MG capsule Commonly known as: AMOXIL   cephALEXin 500 MG capsule Commonly known as: KEFLEX       TAKE these medications    albuterol 108 (90 Base) MCG/ACT inhaler Commonly known as: VENTOLIN HFA Inhale 2 puffs into the lungs every 6 (six) hours as needed for wheezing or shortness of breath.   cefdinir 300 MG capsule Commonly known as: OMNICEF Take 1 capsule (300 mg total) by mouth 2 (two) times daily for 10 days.   ondansetron 4 MG disintegrating tablet Commonly known as: ZOFRAN-ODT 4mg  ODT q4 hours prn nausea/vomit   oxyCODONE-acetaminophen 5-325 MG tablet Commonly known as: Percocet Take 1-2 tablets by mouth every 8 (eight) hours as needed.        Follow-up Information  Primary Care Provider. Schedule an appointment as soon as possible for a visit in 1 week(s).   Why: Hospital Follow Up               Allergies  Allergen Reactions   Trileptal [Oxcarbazepine] Nausea And Vomiting   Allergies as of 01/16/2023       Reactions   Trileptal [oxcarbazepine] Nausea And Vomiting        Medication List     STOP taking these medications    amoxicillin 500 MG capsule Commonly known as: AMOXIL   cephALEXin 500 MG capsule Commonly known as: KEFLEX        TAKE these medications    albuterol 108 (90 Base) MCG/ACT inhaler Commonly known as: VENTOLIN HFA Inhale 2 puffs into the lungs every 6 (six) hours as needed for wheezing or shortness of breath.   cefdinir 300 MG capsule Commonly known as: OMNICEF Take 1 capsule (300 mg total) by mouth 2 (two) times daily for 10 days.   ondansetron 4 MG disintegrating tablet Commonly known as: ZOFRAN-ODT 4mg  ODT q4 hours prn nausea/vomit   oxyCODONE-acetaminophen 5-325 MG tablet Commonly known as: Percocet Take 1-2 tablets by mouth every 8 (eight) hours as needed.        Procedures/Studies: CT ABDOMEN PELVIS W CONTRAST  Result Date: 01/14/2023 CLINICAL DATA:  Left flank pain radiating to back for 2 days. Dysuria. EXAM: CT ABDOMEN AND PELVIS WITH CONTRAST TECHNIQUE: Multidetector CT imaging of the abdomen and pelvis was performed using the standard protocol following bolus administration of intravenous contrast. RADIATION DOSE REDUCTION: This exam was performed according to the departmental dose-optimization program which includes automated exposure control, adjustment of the mA and/or kV according to patient size and/or use of iterative reconstruction technique. CONTRAST:  OMNIPAQUE IOHEXOL 300 MG/ML  SOLN COMPARISON:  07/13/2021. FINDINGS: Lower chest: No acute abnormality. Hepatobiliary: No focal liver abnormality is seen. No gallstones, gallbladder wall thickening, or biliary dilatation. Pancreas: Unremarkable. No pancreatic ductal dilatation or surrounding inflammatory changes. Spleen: Normal in size without focal abnormality. Adrenals/Urinary Tract: The adrenal glands are within normal limits. Slightly delayed nephrogram is noted on the left. No renal calculus bilaterally. No ureteral calculus or obstructive uropathy. There is urothelial enhancement on the left. Perinephric and periureteral fat stranding are noted on the left. Diffuse bladder wall thickening is noted. Stomach/Bowel: Stomach is  within normal limits. Appendix appears normal. No evidence of bowel wall thickening, distention, or inflammatory changes. No free air or pneumatosis. Vascular/Lymphatic: No significant vascular findings are present. Prominent lymph nodes are noted in the periaortic space on the left, likely reactive. Reproductive: Uterus and bilateral adnexa are unremarkable. Other: Small amount of free fluid is noted in the cul-de-sac. There is a fat containing umbilical hernia with mild fat stranding at the omentum and in the hernia sac. Musculoskeletal: Mild degenerative changes are noted in the lower lumbar spine. No acute osseous abnormality. IMPRESSION: 1. Delayed nephrogram on the left with urothelial enhancement and perinephric and periureteral fat stranding on the left suggesting ureteritis/pyelonephritis. No renal calculus or obstructive uropathy bilaterally. 2. Bladder wall thickening, compatible with cystitis. 3. Fat containing umbilical hernia with fat stranding in the hernia pouch and omentum. Correlate clinically to exclude incarceration. Electronically Signed   By: Thornell Sartorius M.D.   On: 01/14/2023 02:59     Subjective: Pt reports she is feeling much better, she is eating and drinking well.  No emesis, no flank pain.  She is eager to discharge home today.  Discharge Exam: Vitals:   01/15/23 2023 01/16/23 0526  BP: (!) 93/53 107/66  Pulse: 81 68  Resp: 17 18  Temp: 98 F (36.7 C) 98.4 F (36.9 C)  SpO2: 96% 97%   Vitals:   01/15/23 0740 01/15/23 1157 01/15/23 2023 01/16/23 0526  BP: 110/60 120/73 (!) 93/53 107/66  Pulse: 88 88 81 68  Resp: 20 20 17 18   Temp:  98.4 F (36.9 C) 98 F (36.7 C) 98.4 F (36.9 C)  TempSrc:  Oral Oral Oral  SpO2: 95% 97% 96% 97%  Weight:      Height:       General: Pt is alert, awake, not in acute distress Cardiovascular: normal S1/S2 +, no rubs, no gallops Respiratory: CTA bilaterally, no wheezing, no rhonchi Abdominal: Soft, NT, ND, bowel sounds  + Extremities: no edema, no cyanosis   The results of significant diagnostics from this hospitalization (including imaging, microbiology, ancillary and laboratory) are listed below for reference.     Microbiology: Recent Results (from the past 240 hour(s))  Blood culture (routine x 2)     Status: None (Preliminary result)   Collection Time: 01/14/23 12:17 AM   Specimen: BLOOD  Result Value Ref Range Status   Specimen Description   Final    BLOOD BLOOD LEFT ARM Performed at [facility], [address] [place], [place] [address]    Special Requests   Final    BOTTLES DRAWN AEROBIC AND ANAEROBIC Blood Culture adequate volume Performed at [facility], [address], [place], [place] [address]    Culture  Setup Time   Final    GRAM NEGATIVE RODS ANAEROBIC BOTTLE ONLY Gram Stain Report Called to,Read Back By and Verified With: ROBERTSON B. AT 0712A ON [id number] BY THOMPSON S. Performed at [facility], [address] [place], [place] [address]    Culture   Final    Romie Minus NEGATIVE RODS IDENTIFICATION TO FOLLOW Performed at [facility], [address], [place], [place] [address]    Report Status PENDING  Incomplete  Blood culture (routine x 2)     Status: Abnormal   Collection Time: 01/14/23 12:20 AM   Specimen: BLOOD LEFT HAND  Result Value Ref Range Status   Specimen Description   Final    BLOOD LEFT HAND Performed at [facility], [address], [place], [place] [address]    Special Requests   Final    BOTTLES DRAWN AEROBIC AND ANAEROBIC Blood Culture adequate volume Performed at [facility], [address] [place], [place] [address]    Culture  Setup Time   Final    ANAEROBIC BOTTLE ONLY GRAM NEGATIVE RODS Gram Stain Report Called to,Read Back By and Verified With: M WHITE RN [phone] KCF  CRITICAL RESULT CALLED TO, READ BACK BY AND VERIFIED WITH: RN Marisa Hua [phone] @1809  FH Performed at [facility], [address], [place], [place] [address]     Culture ESCHERICHIA COLI (A)  Final   Report Status 01/16/2023 FINAL  Final   Organism ID, Bacteria ESCHERICHIA COLI  Final      Susceptibility   Escherichia coli - MIC*    AMPICILLIN >=32 RESISTANT Resistant     CEFEPIME <=0.12 SENSITIVE Sensitive     CEFTAZIDIME <=1 SENSITIVE Sensitive     CEFTRIAXONE <=0.25 SENSITIVE Sensitive     CIPROFLOXACIN <=0.25 SENSITIVE Sensitive     GENTAMICIN <=1 SENSITIVE Sensitive     IMIPENEM <=0.25 SENSITIVE Sensitive  TRIMETH/SULFA <=20 SENSITIVE Sensitive     AMPICILLIN/SULBACTAM >=32 RESISTANT Resistant     PIP/TAZO <=4 SENSITIVE Sensitive     * ESCHERICHIA COLI  Blood Culture ID Panel (Reflexed)     Status: Abnormal   Collection Time: 01/14/23 12:20 AM  Result Value Ref Range Status   Enterococcus faecalis NOT DETECTED NOT DETECTED Final   Enterococcus Faecium NOT DETECTED NOT DETECTED Final   Listeria monocytogenes NOT DETECTED NOT DETECTED Final   Staphylococcus species NOT DETECTED NOT DETECTED Final   Staphylococcus aureus (BCID) NOT DETECTED NOT DETECTED Final   Staphylococcus epidermidis NOT DETECTED NOT DETECTED Final   Staphylococcus lugdunensis NOT DETECTED NOT DETECTED Final   Streptococcus species NOT DETECTED NOT DETECTED Final   Streptococcus agalactiae NOT DETECTED NOT DETECTED Final   Streptococcus pneumoniae NOT DETECTED NOT DETECTED Final   Streptococcus pyogenes NOT DETECTED NOT DETECTED Final   A.calcoaceticus-baumannii NOT DETECTED NOT DETECTED Final   Bacteroides fragilis NOT DETECTED NOT DETECTED Final   Enterobacterales DETECTED (A) NOT DETECTED Final    Comment: Enterobacterales represent a large order of gram negative bacteria, not a single organism. CRITICAL RESULT CALLED TO, READ BACK BY AND VERIFIED WITH: PHARMD K. AHMEND [phone] @ 1645 FH    Enterobacter cloacae complex NOT DETECTED NOT DETECTED Final   Escherichia coli DETECTED (A) NOT DETECTED Final    Comment: CRITICAL RESULT CALLED TO, READ BACK BY AND  VERIFIED WITH: PHARMD K. AHMEND [phone] @ 1645 FH    Klebsiella aerogenes NOT DETECTED NOT DETECTED Final   Klebsiella oxytoca NOT DETECTED NOT DETECTED Final   Klebsiella pneumoniae NOT DETECTED NOT DETECTED Final   Proteus species NOT DETECTED NOT DETECTED Final   Salmonella species NOT DETECTED NOT DETECTED Final   Serratia marcescens NOT DETECTED NOT DETECTED Final   Haemophilus influenzae NOT DETECTED NOT DETECTED Final   Neisseria meningitidis NOT DETECTED NOT DETECTED Final   Pseudomonas aeruginosa NOT DETECTED NOT DETECTED Final   Stenotrophomonas maltophilia NOT DETECTED NOT DETECTED Final   Candida albicans NOT DETECTED NOT DETECTED Final   Candida auris NOT DETECTED NOT DETECTED Final   Candida glabrata NOT DETECTED NOT DETECTED Final   Candida krusei NOT DETECTED NOT DETECTED Final   Candida parapsilosis NOT DETECTED NOT DETECTED Final   Candida tropicalis NOT DETECTED NOT DETECTED Final   Cryptococcus neoformans/gattii NOT DETECTED NOT DETECTED Final   CTX-M ESBL NOT DETECTED NOT DETECTED Final   Carbapenem resistance IMP NOT DETECTED NOT DETECTED Final   Carbapenem resistance KPC NOT DETECTED NOT DETECTED Final   Carbapenem resistance NDM NOT DETECTED NOT DETECTED Final   Carbapenem resist OXA 48 LIKE NOT DETECTED NOT DETECTED Final   Carbapenem resistance VIM NOT DETECTED NOT DETECTED Final    Comment: Performed at [facility], [address], [place], [place] [address]  Urine Culture     Status: Abnormal   Collection Time: 01/14/23  1:43 AM   Specimen: Urine, Random  Result Value Ref Range Status   Specimen Description   Final    URINE, RANDOM Performed at [facility], [address] [place], [place] [address]    Special Requests   Final    NONE Reflexed from [id number] Performed at [facility], [address], [place], [place] [address]    Culture (A)  Final    <10,000 COLONIES/mL INSIGNIFICANT GROWTH Performed at [facility], [address],  [place], [place] [address]    Report Status 01/15/2023 FINAL  Final  Blood culture (routine x 2)     Status: None (Preliminary result)   Collection Time: 01/14/23 10:32 PM   Specimen: BLOOD  Result Value Ref Range Status   Specimen Description BLOOD BLOOD LEFT ARM  Final   Special Requests   Final    BOTTLES DRAWN AEROBIC AND ANAEROBIC Blood Culture results may not be optimal due to an excessive volume of blood received in culture bottles   Culture   Final    NO GROWTH 2 DAYS Performed at [facility] [place], [address] [place], [place] [address]    Report Status PENDING  Incomplete  Blood culture (routine x 2)     Status: None (Preliminary result)   Collection Time: 01/14/23 10:48 PM   Specimen: BLOOD  Result Value Ref Range Status   Specimen Description BLOOD RIGHT ANTECUBITAL  Final   Special Requests   Final    BOTTLES DRAWN AEROBIC AND ANAEROBIC Blood Culture adequate volume   Culture   Final    NO GROWTH 2 DAYS Performed at [facility], [address], [place], [place] [address]    Report Status PENDING  Incomplete     Labs: BNP (last 3 results) No results for input(s): "BNP" in the last 8760 hours. Basic Metabolic Panel: Recent Labs  Lab 01/13/23 2347 01/14/23 2232 01/15/23 0518 01/16/23 0407  NA 134* 137 138 136  K 3.7 3.3* 3.2* 3.7  CL 101 101 106 103  CO2 24 26 25 25   GLUCOSE 112* 96 120* 104*  BUN 8 8 7 8   CREATININE 0.66 0.64 0.58 0.56  CALCIUM 8.8* 8.7* 8.2* 8.5*  MG  --   --  1.9 1.9   Liver Function Tests: Recent Labs  Lab 01/13/23 2347 01/15/23 0518  AST 16 13*  ALT 16 14  ALKPHOS 127* 84  BILITOT 0.8 0.2*  PROT 7.6 5.6*  ALBUMIN 3.7 2.7*   Recent Labs  Lab 01/13/23 2347  LIPASE 38   No results for input(s): "AMMONIA" in the last 168 hours. CBC: Recent Labs  Lab 01/13/23 2347 01/14/23 2232 01/15/23 0518  WBC 10.0 11.3* 8.5  NEUTROABS 6.2  --  5.1  HGB 16.1* 14.9 13.0  HCT 47.4* 43.4 38.0  MCV 96.5 95.0 95.5  PLT 216 210 185    Cardiac Enzymes: No results for input(s): "CKTOTAL", "CKMB", "CKMBINDEX", "TROPONINI" in the last 168 hours. BNP: Invalid input(s): "POCBNP" CBG: No results for input(s): "GLUCAP" in the last 168 hours. D-Dimer No results for input(s): "DDIMER" in the last 72 hours. Hgb A1c No results for input(s): "HGBA1C" in the last 72 hours. Lipid Profile No results for input(s): "CHOL", "HDL", "LDLCALC", "TRIG", "CHOLHDL", "LDLDIRECT" in the last 72 hours. Thyroid function studies No results for input(s): "TSH", "T4TOTAL", "T3FREE", "THYROIDAB" in the last 72 hours.  Invalid input(s): "FREET3" Anemia work up No results for input(s): "VITAMINB12", "FOLATE", "FERRITIN", "TIBC", "IRON", "RETICCTPCT" in the last 72 hours. Urinalysis    Component Value Date/Time   COLORURINE STRAW (A) 01/14/2023 0143   APPEARANCEUR HAZY (A) 01/14/2023 0143   LABSPEC 1.002 (L) 01/14/2023 0143   PHURINE 7.0 01/14/2023 0143   GLUCOSEU NEGATIVE 01/14/2023 0143   HGBUR MODERATE (A) 01/14/2023 0143   BILIRUBINUR NEGATIVE 01/14/2023 0143   KETONESUR NEGATIVE 01/14/2023 0143   PROTEINUR NEGATIVE 01/14/2023 0143   UROBILINOGEN 0.2 10/18/2008 1212   NITRITE NEGATIVE 01/14/2023 0143   LEUKOCYTESUR LARGE (A) 01/14/2023 0143   Sepsis Labs Recent Labs  Lab 01/13/23 2347 01/14/23 2232 01/15/23 0518  WBC 10.0 11.3* 8.5   Microbiology Recent Results (from the past 240 hour(s))  Blood culture (routine x 2)     Status: None (Preliminary result)   Collection Time: 01/14/23 12:17 AM   Specimen: BLOOD  Result Value Ref Range Status   Specimen Description   Final    BLOOD BLOOD LEFT ARM Performed at [facility], [address], [place], [place] [address]    Special Requests   Final    BOTTLES DRAWN AEROBIC AND ANAEROBIC Blood Culture adequate volume Performed at [facility], [address], [place], [place] [address]    Culture  Setup Time   Final    GRAM NEGATIVE RODS ANAEROBIC BOTTLE ONLY Gram Stain Report  Called to,Read Back By and Verified With: ROBERTSON B. AT 0712A ON [id number] BY THOMPSON S. Performed at [facility], [address], [place], [place] [address]    Culture   Final    Romie Minus NEGATIVE RODS IDENTIFICATION TO FOLLOW Performed at [facility], [address] [place], [place] [address]    Report Status PENDING  Incomplete  Blood culture (routine x 2)     Status: Abnormal   Collection Time: 01/14/23 12:20 AM   Specimen: BLOOD LEFT HAND  Result Value Ref Range Status   Specimen Description   Final    BLOOD LEFT HAND Performed at [facility], [address], [place], [place] [address]    Special Requests   Final    BOTTLES DRAWN AEROBIC AND ANAEROBIC Blood Culture adequate volume Performed at [facility], [address] [place], [place] [address]    Culture  Setup Time   Final    ANAEROBIC BOTTLE ONLY GRAM NEGATIVE RODS Gram Stain Report Called to,Read Back By and Verified With: M WHITE RN [phone] KCF  CRITICAL RESULT CALLED TO, READ BACK BY AND VERIFIED WITH: RN Marisa Hua [phone] @1809  FH Performed at [facility], [address] [place], [place] [address]    Culture ESCHERICHIA COLI (A)  Final   Report Status 01/16/2023 FINAL  Final   Organism ID, Bacteria ESCHERICHIA COLI  Final      Susceptibility   Escherichia coli - MIC*    AMPICILLIN >=32 RESISTANT Resistant     CEFEPIME <=0.12 SENSITIVE Sensitive     CEFTAZIDIME <=1 SENSITIVE Sensitive     CEFTRIAXONE <=0.25 SENSITIVE Sensitive     CIPROFLOXACIN <=0.25 SENSITIVE Sensitive     GENTAMICIN <=1 SENSITIVE Sensitive     IMIPENEM <=0.25 SENSITIVE Sensitive     TRIMETH/SULFA <=20 SENSITIVE Sensitive     AMPICILLIN/SULBACTAM >=32 RESISTANT Resistant     PIP/TAZO <=4 SENSITIVE Sensitive     * ESCHERICHIA COLI  Blood Culture ID Panel (Reflexed)     Status: Abnormal   Collection Time: 01/14/23 12:20 AM  Result Value Ref Range Status   Enterococcus faecalis NOT DETECTED NOT DETECTED Final    Enterococcus Faecium NOT DETECTED NOT DETECTED Final   Listeria monocytogenes NOT DETECTED NOT DETECTED Final   Staphylococcus species NOT DETECTED NOT DETECTED Final   Staphylococcus aureus (BCID) NOT DETECTED NOT DETECTED Final   Staphylococcus epidermidis NOT DETECTED NOT DETECTED Final   Staphylococcus lugdunensis NOT DETECTED NOT DETECTED Final   Streptococcus species NOT DETECTED NOT DETECTED Final   Streptococcus agalactiae NOT DETECTED NOT DETECTED Final   Streptococcus pneumoniae NOT DETECTED NOT DETECTED Final   Streptococcus pyogenes NOT DETECTED NOT DETECTED Final   A.calcoaceticus-baumannii NOT DETECTED NOT DETECTED Final  Bacteroides fragilis NOT DETECTED NOT DETECTED Final   Enterobacterales DETECTED (A) NOT DETECTED Final    Comment: Enterobacterales represent a large order of gram negative bacteria, not a single organism. CRITICAL RESULT CALLED TO, READ BACK BY AND VERIFIED WITH: PHARMD K. AHMEND [phone] @ 1645 FH    Enterobacter cloacae complex NOT DETECTED NOT DETECTED Final   Escherichia coli DETECTED (A) NOT DETECTED Final    Comment: CRITICAL RESULT CALLED TO, READ BACK BY AND VERIFIED WITH: PHARMD K. AHMEND [phone] @ 1645 FH    Klebsiella aerogenes NOT DETECTED NOT DETECTED Final   Klebsiella oxytoca NOT DETECTED NOT DETECTED Final   Klebsiella pneumoniae NOT DETECTED NOT DETECTED Final   Proteus species NOT DETECTED NOT DETECTED Final   Salmonella species NOT DETECTED NOT DETECTED Final   Serratia marcescens NOT DETECTED NOT DETECTED Final   Haemophilus influenzae NOT DETECTED NOT DETECTED Final   Neisseria meningitidis NOT DETECTED NOT DETECTED Final   Pseudomonas aeruginosa NOT DETECTED NOT DETECTED Final   Stenotrophomonas maltophilia NOT DETECTED NOT DETECTED Final   Candida albicans NOT DETECTED NOT DETECTED Final   Candida auris NOT DETECTED NOT DETECTED Final   Candida glabrata NOT DETECTED NOT DETECTED Final   Candida krusei NOT DETECTED NOT DETECTED  Final   Candida parapsilosis NOT DETECTED NOT DETECTED Final   Candida tropicalis NOT DETECTED NOT DETECTED Final   Cryptococcus neoformans/gattii NOT DETECTED NOT DETECTED Final   CTX-M ESBL NOT DETECTED NOT DETECTED Final   Carbapenem resistance IMP NOT DETECTED NOT DETECTED Final   Carbapenem resistance KPC NOT DETECTED NOT DETECTED Final   Carbapenem resistance NDM NOT DETECTED NOT DETECTED Final   Carbapenem resist OXA 48 LIKE NOT DETECTED NOT DETECTED Final   Carbapenem resistance VIM NOT DETECTED NOT DETECTED Final    Comment: Performed at [facility], [address], [place], [place] [address]  Urine Culture     Status: Abnormal   Collection Time: 01/14/23  1:43 AM   Specimen: Urine, Random  Result Value Ref Range Status   Specimen Description   Final    URINE, RANDOM Performed at [facility], [address], [place], [place] [address]    Special Requests   Final    NONE Reflexed from [id number] Performed at [facility], [address] [place], [place] [address]    Culture (A)  Final    <10,000 COLONIES/mL INSIGNIFICANT GROWTH Performed at [facility], [address] [place], [place] [address]    Report Status 01/15/2023 FINAL  Final  Blood culture (routine x 2)     Status: None (Preliminary result)   Collection Time: 01/14/23 10:32 PM   Specimen: BLOOD  Result Value Ref Range Status   Specimen Description BLOOD BLOOD LEFT ARM  Final   Special Requests   Final    BOTTLES DRAWN AEROBIC AND ANAEROBIC Blood Culture results may not be optimal due to an excessive volume of blood received in culture bottles   Culture   Final    NO GROWTH 2 DAYS Performed at [facility], [address], [place], [place] [address]    Report Status PENDING  Incomplete  Blood culture (routine x 2)     Status: None (Preliminary result)   Collection Time: 01/14/23 10:48 PM   Specimen: BLOOD  Result Value Ref Range Status   Specimen Description BLOOD RIGHT ANTECUBITAL  Final    Special Requests   Final    BOTTLES DRAWN AEROBIC AND ANAEROBIC Blood Culture  adequate volume   Culture   Final    NO GROWTH 2 DAYS Performed at [facility], [address] [place], [place] [address]    Report Status PENDING  Incomplete   Time coordinating discharge:   SIGNED:  Standley Dakins, MD  [facility] 01/16/2023, 8:28 AM How to contact the [facility] [facility] Attending or Consulting provider 7A - 7P or covering provider during after hours 7P -7A, for this patient?  Check the care team in [facility] and look for a) attending/consulting TRH provider listed and b) the [facility] team listed Log into [email or web address] and use [place]'s universal password to access. If you do not have the password, please contact the hospital operator. Locate the [facility] provider you are looking for under [facility] and page to a number that you can be directly reached. If you still have difficulty reaching the provider, please page the [facility] (Director on Call) for the Hospitalists listed on amion for assistance.

## 2023-01-16 NOTE — Discharge Instructions (Signed)
IMPORTANT INFORMATION: PAY CLOSE ATTENTION   PHYSICIAN DISCHARGE INSTRUCTIONS  Follow with Primary care provider  Patient, No Pcp Per  and other consultants as instructed by your Hospitalist Physician  SEEK MEDICAL CARE OR RETURN TO EMERGENCY ROOM IF SYMPTOMS COME BACK, WORSEN OR NEW PROBLEM DEVELOPS   Please note: You were cared for by a hospitalist during your hospital stay. Every effort will be made to forward records to your primary care provider.  You can request that your primary care provider send for your hospital records if they have not received them.  Once you are discharged, your primary care physician will handle any further medical issues. Please note that NO REFILLS for any discharge medications will be authorized once you are discharged, as it is imperative that you return to your primary care physician (or establish a relationship with a primary care physician if you do not have one) for your post hospital discharge needs so that they can reassess your need for medications and monitor your lab values.  Please get a complete blood count and chemistry panel checked by your Primary MD at your next visit, and again as instructed by your Primary MD.  Get Medicines reviewed and adjusted: Please take all your medications with you for your next visit with your Primary MD  Laboratory/radiological data: Please request your Primary MD to go over all hospital tests and procedure/radiological results at the follow up, please ask your primary care provider to get all Hospital records sent to his/her office.  In some cases, they will be blood work, cultures and biopsy results pending at the time of your discharge. Please request that your primary care provider follow up on these results.  If you are diabetic, please bring your blood sugar readings with you to your follow up appointment with primary care.    Please call and make your follow up appointments as soon as possible.    Also Note  the following: If you experience worsening of your admission symptoms, develop shortness of breath, life threatening emergency, suicidal or homicidal thoughts you must seek medical attention immediately by calling 911 or calling your MD immediately  if symptoms less severe.  You must read complete instructions/literature along with all the possible adverse reactions/side effects for all the Medicines you take and that have been prescribed to you. Take any new Medicines after you have completely understood and accpet all the possible adverse reactions/side effects.   Do not drive when taking Pain medications or sleeping medications (Benzodiazepines)  Do not take more than prescribed Pain, Sleep and Anxiety Medications. It is not advisable to combine anxiety,sleep and pain medications without talking with your primary care practitioner  Special Instructions: If you have smoked or chewed Tobacco  in the last 2 yrs please stop smoking, stop any regular Alcohol  and or any Recreational drug use.  Wear Seat belts while driving.  Do not drive if taking any narcotic, mind altering or controlled substances or recreational drugs or alcohol.        

## 2023-01-16 NOTE — Plan of Care (Signed)

## 2023-01-17 ENCOUNTER — Telehealth (HOSPITAL_BASED_OUTPATIENT_CLINIC_OR_DEPARTMENT_OTHER): Payer: Self-pay | Admitting: *Deleted

## 2023-01-17 NOTE — Telephone Encounter (Signed)
Post ED Visit - Positive Culture Follow-up  Culture report reviewed by antimicrobial stewardship pharmacist: [facility] []  Enzo Bi, Pharm.D. []  Celedonio Miyamoto, [address].D., BCPS AQ-ID []  Garvin Fila, Pharm.D., BCPS []  Georgina Pillion, Pharm.D., BCPS []  [place], [address].D., BCPS, AAHIVP []  Estella Husk, Pharm.D., BCPS, AAHIVP []  Lysle Pearl, PharmD, BCPS []  Phillips Climes, PharmD, BCPS []  Agapito Games, PharmD, BCPS []  Verlan Friends, PharmD []  Mervyn Gay, PharmD, BCPS [x]  Delmar Landau, PharmD  [facility] []  Len Childs, PharmD []  Greer Pickerel, PharmD []  Adalberto Cole, PharmD []  Perlie Gold, Rph []  Lonell Face) Jean Rosenthal, PharmD []  Earl Many, PharmD []  Junita Push, PharmD []  Dorna Leitz, PharmD []  Terrilee Files, PharmD []  Lynann Beaver, PharmD []  Keturah Barre, PharmD []  Loralee Pacas, PharmD []  Bernadene Person, PharmD   Positive blood culture Pt admitted for this culture and discharged to complete course of Cefdinir. Repeat cultures are NGTD. No action required at this time  Patsey Berthold 01/17/2023, 12:25 PM

## 2023-01-18 ENCOUNTER — Telehealth (HOSPITAL_BASED_OUTPATIENT_CLINIC_OR_DEPARTMENT_OTHER): Payer: Self-pay | Admitting: *Deleted

## 2023-01-18 NOTE — Telephone Encounter (Signed)
Post ED Visit - Positive Culture Follow-up  Culture report reviewed by antimicrobial stewardship pharmacist: [facility] [x]  Estill Batten, Pharm.D. []  Celedonio Miyamoto, [address].D., BCPS AQ-ID []  Garvin Fila, Pharm.D., BCPS []  Georgina Pillion, Pharm.D., BCPS []  [place], [address].D., BCPS, AAHIVP []  Estella Husk, Pharm.D., BCPS, AAHIVP []  Lysle Pearl, PharmD, BCPS []  Phillips Climes, PharmD, BCPS []  Agapito Games, PharmD, BCPS []  Verlan Friends, PharmD []  Mervyn Gay, PharmD, BCPS []  Vinnie Level, PharmD  [facility] []  Len Childs, PharmD []  Greer Pickerel, PharmD []  Adalberto Cole, PharmD []  Perlie Gold, Rph []  Lonell Face) Jean Rosenthal, PharmD []  Earl Many, PharmD []  Junita Push, PharmD []  Dorna Leitz, PharmD []  Terrilee Files, PharmD []  Lynann Beaver, PharmD []  Keturah Barre, PharmD []  Loralee Pacas, PharmD []  Bernadene Person, PharmD   Positive urine culture Admitted 07/18, stopped Keflex and started Cefdinir and no further patient follow-up is required at this time.  Virl Axe [facility] 01/18/2023, 9:49 AM

## 2023-01-19 LAB — CULTURE, BLOOD (ROUTINE X 2): Culture: NO GROWTH

## 2023-01-20 ENCOUNTER — Telehealth: Payer: Self-pay

## 2023-01-20 NOTE — Telephone Encounter (Signed)
Received referral to reach out to patient from [facility] at [facility] on 01/15/23. I wanted to inform the patient about the [facility] Connect program of [facility] and find out if she has a PCP. No answer for patient. Unable to leave a voicemail but a curtesy text was sent to the patient today.

## 2023-01-31 MED ORDER — ASPIRIN 81 MG PO TBEC
DELAYED_RELEASE_TABLET | ORAL | Status: AC
  Filled 2023-01-31: qty 1

## 2023-06-01 ENCOUNTER — Emergency Department (HOSPITAL_COMMUNITY): Payer: Self-pay

## 2023-06-01 ENCOUNTER — Other Ambulatory Visit: Payer: Self-pay

## 2023-06-01 ENCOUNTER — Encounter (HOSPITAL_COMMUNITY): Payer: Self-pay | Admitting: Emergency Medicine

## 2023-06-01 ENCOUNTER — Emergency Department (HOSPITAL_COMMUNITY)
Admission: EM | Admit: 2023-06-01 | Discharge: 2023-06-02 | Disposition: A | Discharge status: Home / Self Care | Payer: Self-pay | Attending: Emergency Medicine | Admitting: Emergency Medicine

## 2023-06-01 DIAGNOSIS — F17200 Nicotine dependence, unspecified, uncomplicated: Secondary | ICD-10-CM | POA: Insufficient documentation

## 2023-06-01 DIAGNOSIS — J9801 Acute bronchospasm: Secondary | ICD-10-CM | POA: Insufficient documentation

## 2023-06-01 DIAGNOSIS — D72829 Elevated white blood cell count, unspecified: Secondary | ICD-10-CM | POA: Insufficient documentation

## 2023-06-01 LAB — URINALYSIS, ROUTINE W REFLEX MICROSCOPIC
Bilirubin Urine: NEGATIVE
Glucose, UA: NEGATIVE mg/dL
Hgb urine dipstick: NEGATIVE
Ketones, ur: NEGATIVE mg/dL
Nitrite: NEGATIVE
Protein, ur: NEGATIVE mg/dL
Specific Gravity, Urine: 1.023 (ref 1.005–1.030)
pH: 6 (ref 5.0–8.0)

## 2023-06-01 LAB — CBC
HCT: 46.4 % — ABNORMAL HIGH (ref 36.0–46.0)
Hemoglobin: 16.2 g/dL — ABNORMAL HIGH (ref 12.0–15.0)
MCH: 33.5 pg (ref 26.0–34.0)
MCHC: 34.9 g/dL (ref 30.0–36.0)
MCV: 95.9 fL (ref 80.0–100.0)
Platelets: 259 10*3/uL (ref 150–400)
RBC: 4.84 MIL/uL (ref 3.87–5.11)
RDW: 12.8 % (ref 11.5–15.5)
WBC: 11.3 10*3/uL — ABNORMAL HIGH (ref 4.0–10.5)
nRBC: 0 % (ref 0.0–0.2)

## 2023-06-01 LAB — BASIC METABOLIC PANEL
Anion gap: 11 (ref 5–15)
BUN: 10 mg/dL (ref 6–20)
CO2: 25 mmol/L (ref 22–32)
Calcium: 9.5 mg/dL (ref 8.9–10.3)
Chloride: 101 mmol/L (ref 98–111)
Creatinine, Ser: 0.69 mg/dL (ref 0.44–1.00)
GFR, Estimated: >60 mL/min (ref >60)
Glucose, Bld: 98 mg/dL (ref 70–99)
Potassium: 3.8 mmol/L (ref 3.5–5.1)
Sodium: 137 mmol/L (ref 135–145)

## 2023-06-01 LAB — TROPONIN I (HIGH SENSITIVITY): Troponin I (High Sensitivity): 2 ng/L (ref <18)

## 2023-06-01 NOTE — ED Triage Notes (Signed)
Pt in with sinus infection that began 2 wks ago, pt states she has been coughing constantly for a few days now. Has rib and chest pain, reporting some chills.

## 2023-06-02 LAB — PREGNANCY, URINE: Preg Test, Ur: NEGATIVE

## 2023-06-02 LAB — TROPONIN I (HIGH SENSITIVITY): Troponin I (High Sensitivity): 4 ng/L (ref <18)

## 2023-06-02 MED ORDER — DOXYCYCLINE HYCLATE 100 MG PO CAPS: 100.0000 mg | ORAL_CAPSULE | Freq: Two times a day (BID) | ORAL | 0 refills | Status: AC

## 2023-06-02 MED ORDER — DEXAMETHASONE 4 MG PO TABS
8.0000 mg | ORAL_TABLET | Freq: Once | ORAL | Status: AC
  Administered 2023-06-02: 8 mg via ORAL
  Filled 2023-06-02: qty 2

## 2023-06-02 MED ORDER — ALBUTEROL SULFATE HFA 108 (90 BASE) MCG/ACT IN AERS
2.0000 | INHALATION_SPRAY | Freq: Once | RESPIRATORY_TRACT | Status: AC
  Administered 2023-06-02: 2 via RESPIRATORY_TRACT
  Filled 2023-06-02: qty 6.7

## 2023-06-02 NOTE — ED Provider Notes (Signed)
EMERGENCY DEPARTMENT AT [facility] Provider Note   CSN: [id number] Arrival date & time: 06/01/23  2105     History  Chief Complaint  Patient presents with   Chest Pain   Cough    [name] is a 32 y.o. female.  The history is provided by the patient.  Patient history of asthma, GERD presents with cough for 2 weeks.  Patient reports she has had an upper respiratory infection for at least 2 weeks.  She reports cough with white phlegm production, no hemoptysis.  She does report wheezing and shortness of breath.  She reports occasional chest tightness.  No fevers or vomiting. She is a current smoker.  She has also been having back pain with cough.    Past Medical History:  Diagnosis Date   Asthma    Bipolar 1 disorder (HCC)    " states she uded to be when she was a child"   GERD (gastroesophageal reflux disease)     Home Medications Prior to Admission medications   Medication Sig Start Date End Date Taking? Authorizing Provider  doxycycline (VIBRAMYCIN) 100 MG capsule Take 1 capsule (100 mg total) by mouth 2 (two) times daily. 06/02/23  Yes Zadie Rhine, MD  albuterol (PROVENTIL HFA;VENTOLIN HFA) 108 (90 Base) MCG/ACT inhaler Inhale 2 puffs into the lungs every 6 (six) hours as needed for wheezing or shortness of breath.    [provider]  ondansetron (ZOFRAN-ODT) 4 MG disintegrating tablet 4mg  ODT q4 hours prn nausea/vomit 01/14/23   Mesner, Barbara Cower, MD  oxyCODONE-acetaminophen (PERCOCET) 5-325 MG tablet Take 1-2 tablets by mouth every 8 (eight) hours as needed. 01/14/23   Mesner, Barbara Cower, MD      Allergies    Trileptal [oxcarbazepine]    Review of Systems   Review of Systems  Constitutional:  Negative for fever.  Respiratory:  Positive for cough, shortness of breath and wheezing.   Gastrointestinal:  Negative for vomiting.    Physical Exam Updated Vital Signs BP 108/78   Pulse 80   Temp 98.2 F (36.8 C) (Oral)   Resp 19   Wt  108.6 kg   LMP  (LMP Unknown)   SpO2 94%   BMI 37.50 kg/m  Physical Exam CONSTITUTIONAL: Well developed/well nourished, sleeping in no distress HEAD: Normocephalic/atraumatic EYES: EOMI/PERRL ENMT: Mucous membranes moist, uvula midline, no erythema or exudate, poor dentition NECK: supple no meningeal signs CV: S1/S2 noted, no murmurs/rubs/gallops noted LUNGS: Coarse wheezing bilaterally, no acute distress, room air pulse ox 94% ABDOMEN: soft, nontender GU:no cva tenderness NEURO: Pt is awake/alert/appropriate, moves all extremitiesx4.  No facial droop.   EXTREMITIES: pulses normal/equal, full ROM, no lower extremity edema SKIN: warm, color normal  ED Results / Procedures / Treatments   Labs (all labs ordered are listed, but only abnormal results are displayed) Labs Reviewed  CBC - Abnormal; Notable for the following components:      Result Value   WBC 11.3 (*)    Hemoglobin 16.2 (*)    HCT 46.4 (*)    All other components within normal limits  URINALYSIS, ROUTINE W REFLEX MICROSCOPIC - Abnormal; Notable for the following components:   APPearance CLOUDY (*)    Leukocytes,Ua TRACE (*)    Bacteria, UA RARE (*)    All other components within normal limits  BASIC METABOLIC PANEL  PREGNANCY, URINE  TROPONIN I (HIGH SENSITIVITY)  TROPONIN I (HIGH SENSITIVITY)    EKG EKG Interpretation Date/Time:  Tuesday June 01 2023  21:17:56 EST Ventricular Rate:  84 PR Interval:  142 QRS Duration:  90 QT Interval:  358 QTC Calculation: 423 R Axis:   38  Text Interpretation: Normal sinus rhythm Normal ECG When compared with ECG of 14-Jan-2023 23:13, PREVIOUS ECG IS PRESENT Confirmed by Zadie Rhine ([id number]) on 06/01/2023 11:57:51 PM  Radiology DG Chest 2 View  Result Date: 06/01/2023 CLINICAL DATA:  Shortness of breath and productive cough. EXAM: CHEST - 2 VIEW COMPARISON:  April 28, 2009 FINDINGS: The heart size and mediastinal contours are within normal limits. Both lungs  are clear. The visualized skeletal structures are unremarkable. IMPRESSION: No active cardiopulmonary disease. Electronically Signed   By: Aram Candela M.D.   On: 06/01/2023 22:16    Procedures Procedures    Medications Ordered in ED Medications  albuterol (VENTOLIN HFA) 108 (90 Base) MCG/ACT inhaler 2 puff (2 puffs Inhalation Given 06/02/23 0451)  dexamethasone (DECADRON) tablet 8 mg (8 mg Oral Given 06/02/23 0451)    ED Course/ Medical Decision Making/ A&P                                 Medical Decision Making Amount and/or Complexity of Data Reviewed Labs: ordered. Radiology: ordered.  Risk Prescription drug management.   This patient presents to the ED for concern of cough, this involves an extensive number of treatment options, and is a complaint that carries with it a high risk of complications and morbidity.  The differential diagnosis includes but is not limited to pneumonia, PE, URI, asthma exacerbation  Comorbidities that complicate the patient evaluation: Patient's presentation is complicated by their history of asthma  Social Determinants of Health: Patient's lack of insurance and tobacco use   increases the complexity of managing their presentation  Additional history obtained: Records reviewed previous admission documents  Lab Tests: I Ordered, and personally interpreted labs.  The pertinent results include: Mild leukocytosis    Imaging Studies ordered: I ordered imaging studies including X-ray chest   I independently visualized and interpreted imaging which showed no acute findings I agree with the radiologist interpretation   Medicines ordered and prescription drug management: I ordered medication including albuterol for wheezing  Complexity of problems addressed: Patient's presentation is most consistent with  acute presentation with potential threat to life or bodily function  Disposition: After consideration of the diagnostic results and the  patient's response to treatment,  I feel that the patent would benefit from discharge   .    Patient with cough for up to 2 weeks in the setting of tobacco use.  Patient with wheezing on exam.  X-ray clear.  However given chronicity of symptoms, will start her on oral antibiotics.  Also started on albuterol one-time dose of Decadron.  Patient safe for discharge       Final Clinical Impression(s) / ED Diagnoses Final diagnoses:  Acute bronchospasm    Rx / DC Orders ED Discharge Orders          Ordered    doxycycline (VIBRAMYCIN) 100 MG capsule  2 times daily        06/02/23 0443              Zadie Rhine, MD 06/02/23 [phone]

## 2023-06-02 NOTE — ED Notes (Signed)
Patient verbalizes understanding of discharge instructions. Opportunity for questioning and answers were provided. Armband removed by staff, pt discharged from ED. Ambulated out to lobby  

## 2023-11-23 ENCOUNTER — Other Ambulatory Visit: Payer: Self-pay

## 2023-11-23 ENCOUNTER — Emergency Department (HOSPITAL_COMMUNITY)
Admission: EM | Admit: 2023-11-23 | Discharge: 2023-11-23 | Disposition: A | Discharge status: Home / Self Care | Payer: Self-pay | Attending: Emergency Medicine | Admitting: Emergency Medicine

## 2023-11-23 ENCOUNTER — Encounter (HOSPITAL_COMMUNITY): Payer: Self-pay | Admitting: *Deleted

## 2023-11-23 ENCOUNTER — Emergency Department (HOSPITAL_COMMUNITY): Payer: Self-pay

## 2023-11-23 DIAGNOSIS — Y990 Civilian activity done for income or pay: Secondary | ICD-10-CM | POA: Insufficient documentation

## 2023-11-23 DIAGNOSIS — S61214A Laceration without foreign body of right ring finger without damage to nail, initial encounter: Secondary | ICD-10-CM | POA: Insufficient documentation

## 2023-11-23 DIAGNOSIS — W268XXA Contact with other sharp object(s), not elsewhere classified, initial encounter: Secondary | ICD-10-CM | POA: Insufficient documentation

## 2023-11-23 DIAGNOSIS — Z23 Encounter for immunization: Secondary | ICD-10-CM | POA: Insufficient documentation

## 2023-11-23 MED ORDER — TETANUS-DIPHTH-ACELL PERTUSSIS 5-2.5-18.5 LF-MCG/0.5 IM SUSY
0.5000 mL | PREFILLED_SYRINGE | Freq: Once | INTRAMUSCULAR | Status: AC
  Administered 2023-11-23: 0.5 mL via INTRAMUSCULAR
  Filled 2023-11-23: qty 0.5

## 2023-11-23 MED ORDER — LIDOCAINE HCL (PF) 2 % IJ SOLN: 10.0000 mL | Freq: Once | INTRAMUSCULAR | Status: AC

## 2023-11-23 MED ORDER — LIDOCAINE HCL (PF) 2 % IJ SOLN
INTRAMUSCULAR | Status: AC
  Administered 2023-11-23: 5 mL via INTRADERMAL
  Filled 2023-11-23: qty 10

## 2023-11-23 NOTE — Discharge Instructions (Signed)
Have your sutures removed in 10 days.  Keep your wound clean and dry,  Until a good scab forms - you may then wash gently twice daily with mild soap and water, but dry completely after.  Get rechecked for any sign of infection (redness,  Swelling,  Increased pain or drainage of purulent fluid).

## 2023-11-23 NOTE — ED Provider Notes (Signed)
 [place] EMERGENCY DEPARTMENT AT [facility] Provider Note   CSN: [id number] Arrival date & time: 11/23/23  1058     History  Chief Complaint  Patient presents with   Laceration    [name] is a 33 y.o. 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  She denies numbness distal to the injury site and wound has stopped bleeding since application of pressure.  She is not current with her tetanus vaccine.   The history is provided by the patient.       Home Medications Prior to Admission medications   Medication Sig Start Date End Date Taking? Authorizing Provider  albuterol  (PROVENTIL  HFA;VENTOLIN  HFA) 108 (90 Base) MCG/ACT inhaler Inhale 2 puffs into the lungs every 6 (six) hours as needed for wheezing or shortness of breath.    [provider]  doxycycline  (VIBRAMYCIN ) 100 MG capsule Take 1 capsule (100 mg total) by mouth 2 (two) times daily. 06/02/23   Eldon Greenland, MD  ondansetron  (ZOFRAN -ODT) 4 MG disintegrating tablet 4mg  ODT q4 hours prn nausea/vomit 01/14/23   Mesner, Reymundo Caulk, MD  oxyCODONE -acetaminophen  (PERCOCET) 5-325 MG tablet Take 1-2 tablets by mouth every 8 (eight) hours as needed. 01/14/23   Mesner, Reymundo Caulk, MD      Allergies    Trileptal [oxcarbazepine]    Review of Systems   Review of Systems  Constitutional:  Negative for chills and fever.  Musculoskeletal:  Negative for arthralgias.  Skin:  Positive for wound.  Neurological:  Negative for numbness.  All other systems reviewed and are negative.   Physical Exam Updated Vital Signs BP (!) 129/93 (BP Location: Left Arm)   Pulse 91   Temp 97.8 F (36.6 C) (Oral)   Resp 18   Ht 5\' 7"  (1.702 m)   Wt 99.3 kg   LMP 11/20/2023   SpO2 98%   BMI 34.30 kg/m  Physical Exam Constitutional:      Appearance: She is well-developed.  HENT:     Head: Normocephalic.  Cardiovascular:     Rate and Rhythm: Normal rate.  Pulmonary:      Effort: Pulmonary effort is normal.  Musculoskeletal:        General: Normal range of motion.  Skin:    Findings: Laceration present.     Comments: 1 cm curved laceration right ring finger distal volar tuft.  Hemostatic.  Neurological:     Mental Status: She is alert and oriented to person, place, and time.     Sensory: No sensory deficit.     ED Results / Procedures / Treatments   Labs (all labs ordered are listed, but only abnormal results are displayed) Labs Reviewed - No data to display  EKG None  Radiology No results found.  Procedures Procedures   LACERATION REPAIR Performed by: Katherine Pancake Authorized by: Katherine Pancake Consent: Verbal consent obtained. Risks and benefits: risks, benefits and alternatives were discussed Consent given by: patient Patient identity confirmed: provided demographic data Prepped and Draped in normal sterile fashion Wound explored to depth, no fb.   Laceration Location: right ring finger  Laceration Length: 1cm  No Foreign Bodies seen or palpated  Anesthesia: local infiltration  Local anesthetic: lidocaine  2% without epinephrine   Anesthetic total: 3 ml  Irrigation method: syringe Amount of cleaning: standard  Skin closure: ethilon 4-0  Number of sutures: 3  Technique: simple interrupted.  Patient tolerance: Patient tolerated the procedure well with no immediate complications.  Medications  Ordered in ED Medications  lidocaine  HCl (PF) (XYLOCAINE ) 2 % injection 10 mL (5 mLs Intradermal Given 11/23/23 1259)  Tdap (BOOSTRIX) injection 0.5 mL (0.5 mLs Intramuscular Given 11/23/23 1258)    ED Course/ Medical Decision Making/ A&P                                 Medical Decision Making Laceration right ring finger,  uncomplicated.  Pt tolerated repair well.  Tetanus updated.  Wound care instructions given.  Pt advised to have sutures removed in 10 days,  Return here sooner for any signs of infection including redness, swelling,  worse pain or drainage of pus.  No fb.  No joint involvement, no indication for imaging.     Amount and/or Complexity of Data Reviewed Radiology: ordered.  Risk Prescription drug management.           Final Clinical Impression(s) / ED Diagnoses Final diagnoses:  Laceration of right ring finger without foreign body without damage to nail, initial encounter    Rx / DC Orders ED Discharge Orders     None         Jhalen Eley, PA-C 11/23/23 1354    Mozell Arias, MD 11/23/23 [phone]

## 2023-11-23 NOTE — ED Triage Notes (Signed)
 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

## 2023-11-28 ENCOUNTER — Emergency Department (HOSPITAL_COMMUNITY)
Admission: EM | Admit: 2023-11-28 | Discharge: 2023-11-28 | Disposition: A | Discharge status: Home / Self Care | Payer: Self-pay | Source: Home / Self Care
